# Patient Record
Sex: Female | Born: 1960 | Race: White | Hispanic: No | Smoking: Former smoker
Health system: Southern US, Community
[De-identification: ages and names within clinical notes are randomized; demographics above are authoritative.]

## PROBLEM LIST (undated history)

## (undated) DIAGNOSIS — J45909 Unspecified asthma, uncomplicated: Secondary | ICD-10-CM

## (undated) DIAGNOSIS — B1920 Unspecified viral hepatitis C without hepatic coma: Secondary | ICD-10-CM

## (undated) DIAGNOSIS — E1110 Type 2 diabetes mellitus with ketoacidosis without coma: Secondary | ICD-10-CM

## (undated) DIAGNOSIS — F319 Bipolar disorder, unspecified: Secondary | ICD-10-CM

## (undated) DIAGNOSIS — K760 Fatty (change of) liver, not elsewhere classified: Secondary | ICD-10-CM

## (undated) DIAGNOSIS — E119 Type 2 diabetes mellitus without complications: Secondary | ICD-10-CM

---

## 2001-04-03 ENCOUNTER — Emergency Department (HOSPITAL_COMMUNITY): Admission: EM | Admit: 2001-04-03 | Discharge: 2001-04-04 | Payer: Self-pay | Admitting: *Deleted

## 2003-03-26 ENCOUNTER — Emergency Department (HOSPITAL_COMMUNITY): Admission: EM | Admit: 2003-03-26 | Discharge: 2003-03-26 | Payer: Self-pay | Admitting: Family Medicine

## 2003-04-03 ENCOUNTER — Encounter: Admission: RE | Admit: 2003-04-03 | Discharge: 2003-04-03 | Payer: Self-pay | Admitting: Internal Medicine

## 2004-05-23 ENCOUNTER — Emergency Department (HOSPITAL_COMMUNITY): Admission: EM | Admit: 2004-05-23 | Discharge: 2004-05-23 | Payer: Self-pay | Admitting: Emergency Medicine

## 2005-08-20 ENCOUNTER — Emergency Department (HOSPITAL_COMMUNITY): Admission: EM | Admit: 2005-08-20 | Discharge: 2005-08-20 | Payer: Self-pay | Admitting: Emergency Medicine

## 2006-10-28 ENCOUNTER — Emergency Department: Payer: Self-pay | Admitting: Emergency Medicine

## 2006-10-29 ENCOUNTER — Other Ambulatory Visit: Payer: Self-pay

## 2006-10-29 ENCOUNTER — Emergency Department: Payer: Self-pay | Admitting: Emergency Medicine

## 2007-02-09 ENCOUNTER — Emergency Department: Payer: Self-pay | Admitting: Emergency Medicine

## 2011-11-21 ENCOUNTER — Encounter (HOSPITAL_COMMUNITY): Payer: Self-pay | Admitting: Emergency Medicine

## 2011-11-21 ENCOUNTER — Emergency Department (HOSPITAL_COMMUNITY)
Admission: EM | Admit: 2011-11-21 | Discharge: 2011-11-24 | Disposition: A | Discharge status: Home / Self Care | Payer: MEDICAID | Attending: Emergency Medicine | Admitting: Emergency Medicine

## 2011-11-21 DIAGNOSIS — F29 Unspecified psychosis not due to a substance or known physiological condition: Secondary | ICD-10-CM

## 2011-11-21 DIAGNOSIS — Z046 Encounter for general psychiatric examination, requested by authority: Secondary | ICD-10-CM | POA: Insufficient documentation

## 2011-11-21 DIAGNOSIS — F319 Bipolar disorder, unspecified: Secondary | ICD-10-CM | POA: Insufficient documentation

## 2011-11-21 HISTORY — DX: Bipolar disorder, unspecified: F31.9

## 2011-11-21 HISTORY — DX: Unspecified viral hepatitis C without hepatic coma: B19.20

## 2011-11-21 LAB — COMPREHENSIVE METABOLIC PANEL
ALT: 69 U/L — ABNORMAL HIGH (ref 0–35)
AST: 96 U/L — ABNORMAL HIGH (ref 0–37)
Albumin: 4.2 g/dL (ref 3.5–5.2)
Alkaline Phosphatase: 89 U/L (ref 39–117)
BUN: 7 mg/dL (ref 6–23)
CO2: 22 mEq/L (ref 19–32)
Calcium: 9.9 mg/dL (ref 8.4–10.5)
Chloride: 102 mEq/L (ref 96–112)
Creatinine, Ser: 0.74 mg/dL (ref 0.50–1.10)
GFR calc Af Amer: >90 mL/min (ref >90)
GFR calc non Af Amer: >90 mL/min (ref >90)
Glucose, Bld: 116 mg/dL — ABNORMAL HIGH (ref 70–99)
Potassium: 4 mEq/L (ref 3.5–5.1)
Sodium: 136 mEq/L (ref 135–145)
Total Bilirubin: 1.4 mg/dL — ABNORMAL HIGH (ref 0.3–1.2)
Total Protein: 7.6 g/dL (ref 6.0–8.3)

## 2011-11-21 LAB — RAPID URINE DRUG SCREEN, HOSP PERFORMED
Amphetamines: NOT DETECTED
Barbiturates: NOT DETECTED
Benzodiazepines: NOT DETECTED
Cocaine: NOT DETECTED
Opiates: NOT DETECTED
Tetrahydrocannabinol: NOT DETECTED

## 2011-11-21 LAB — CBC
HCT: 40.6 % (ref 36.0–46.0)
Hemoglobin: 14 g/dL (ref 12.0–15.0)
MCH: 33.3 pg (ref 26.0–34.0)
MCHC: 34.5 g/dL (ref 30.0–36.0)
MCV: 96.7 fL (ref 78.0–100.0)
Platelets: 142 10*3/uL — ABNORMAL LOW (ref 150–400)
RBC: 4.2 MIL/uL (ref 3.87–5.11)
RDW: 12.7 % (ref 11.5–15.5)
WBC: 7.6 10*3/uL (ref 4.0–10.5)

## 2011-11-21 LAB — URINE MICROSCOPIC-ADD ON

## 2011-11-21 LAB — URINALYSIS, ROUTINE W REFLEX MICROSCOPIC
Bilirubin Urine: NEGATIVE
Glucose, UA: NEGATIVE mg/dL
Hgb urine dipstick: NEGATIVE
Ketones, ur: NEGATIVE mg/dL
Nitrite: NEGATIVE
Protein, ur: NEGATIVE mg/dL
Specific Gravity, Urine: 1.01 (ref 1.005–1.030)
Urobilinogen, UA: 1 mg/dL (ref 0.0–1.0)
pH: 6.5 (ref 5.0–8.0)

## 2011-11-21 LAB — AMMONIA: Ammonia: 25 umol/L (ref 11–60)

## 2011-11-21 LAB — LITHIUM LEVEL: Lithium Lvl: 0.57 mEq/L — ABNORMAL LOW (ref 0.80–1.40)

## 2011-11-21 LAB — ETHANOL: Alcohol, Ethyl (B): <11 mg/dL (ref 0–11)

## 2011-11-21 LAB — POCT PREGNANCY, URINE: Preg Test, Ur: NEGATIVE

## 2011-11-21 MED ORDER — LORAZEPAM 1 MG PO TABS
1.0000 mg | ORAL_TABLET | Freq: Three times a day (TID) | ORAL | Status: DC | PRN
  Administered 2011-11-22 – 2011-11-23 (×2): 1 mg via ORAL
  Filled 2011-11-21 (×2): qty 1

## 2011-11-21 MED ORDER — ALUM & MAG HYDROXIDE-SIMETH 200-200-20 MG/5ML PO SUSP: 30.0000 mL | ORAL | Status: DC | PRN

## 2011-11-21 MED ORDER — OLANZAPINE 5 MG PO TABS
5.0000 mg | ORAL_TABLET | Freq: Two times a day (BID) | ORAL | Status: DC
  Administered 2011-11-21 – 2011-11-24 (×6): 5 mg via ORAL
  Filled 2011-11-21 (×8): qty 1

## 2011-11-21 MED ORDER — NICOTINE 21 MG/24HR TD PT24
21.0000 mg | MEDICATED_PATCH | Freq: Every day | TRANSDERMAL | Status: DC
  Administered 2011-11-21 – 2011-11-24 (×4): 21 mg via TRANSDERMAL
  Filled 2011-11-21 (×3): qty 1

## 2011-11-21 MED ORDER — OLANZAPINE 5 MG PO TABS
ORAL_TABLET | ORAL | Status: AC
  Filled 2011-11-21: qty 1

## 2011-11-21 MED ORDER — ACETAMINOPHEN 325 MG PO TABS
650.0000 mg | ORAL_TABLET | ORAL | Status: DC | PRN
  Administered 2011-11-24: 650 mg via ORAL

## 2011-11-21 MED ORDER — ZOLPIDEM TARTRATE 5 MG PO TABS
5.0000 mg | ORAL_TABLET | Freq: Every evening | ORAL | Status: DC | PRN
  Administered 2011-11-21: 5 mg via ORAL
  Filled 2011-11-21: qty 1

## 2011-11-21 MED ORDER — IBUPROFEN 400 MG PO TABS: 400.0000 mg | ORAL_TABLET | Freq: Three times a day (TID) | ORAL | Status: DC | PRN

## 2011-11-21 MED ORDER — ONDANSETRON HCL 4 MG PO TABS: 4.0000 mg | ORAL_TABLET | Freq: Three times a day (TID) | ORAL | Status: DC | PRN

## 2011-11-21 NOTE — ED Notes (Signed)
rn spoke to [name] from act, notified of pts arrival to ed and stated she will be to see pt later today

## 2011-11-21 NOTE — ED Notes (Signed)
ACT is at the bedside with pt

## 2011-11-21 NOTE — ED Notes (Signed)
Tele psych consult initiated

## 2011-11-21 NOTE — ED Provider Notes (Addendum)
History     CSN: [id number]  Arrival date & time 11/21/11  1217   First MD Initiated Contact with Patient 11/21/11 1312      Chief Complaint  Patient presents with  . V70.1     The history is provided by the patient, a relative and a parent. History Limited By: psychosis.  Pt was seen at 1325.  Per pt's father, pt has been "acting strange" for the past week.  Pt's father describes pt's symptoms as staring off in space, taking pictures off the walls, yelling at her mother, and wandering around at night.  Pt has been refusing to go to [facility] this past week for eval, threatening to "jump out of the car" if driven there by her father.  Pt denies SI, no HI.    Past Medical History  Diagnosis Date  . Bipolar 1 disorder   . Hepatitis C     History reviewed. No pertinent past surgical history.   History  Substance Use Topics  . Smoking status: Not on file  . Smokeless tobacco: Not on file  . Alcohol Use:     Review of Systems  Unable to perform ROS: Psychiatric disorder    Allergies  Review of patient's allergies indicates no known allergies.  Home Medications   Current Outpatient Rx  Name Route Sig Dispense Refill  . ACETAMINOPHEN 500 MG PO TABS Oral Take 500 mg by mouth every 6 (six) hours as needed. Pain.    Marland Kitchen LITHIUM CARBONATE 300 MG PO CAPS Oral Take 300 mg by mouth 2 (two) times daily with a meal.      BP 134/76  Pulse 79  Temp 98.3 F (36.8 C) (Oral)  Resp 18  Ht 5\' 6"  (1.676 m)  Wt 120 lb (54.432 kg)  BMI 19.37 kg/m2  SpO2 99%  Physical Exam 1330: Physical examination:  Nursing notes reviewed; Vital signs and O2 SAT reviewed;  Constitutional: Well developed, Well nourished, Well hydrated, In no acute distress; Head:  Normocephalic, atraumatic; Eyes: EOMI, PERRL, No scleral icterus; ENMT: Mouth and pharynx normal, Mucous membranes moist; Neck: Supple, Full range of motion, No lymphadenopathy; Cardiovascular: Regular rate and rhythm, No murmur, rub, or  gallop; Respiratory: Breath sounds clear & equal bilaterally, No rales, rhonchi, wheezes.  Speaking full sentences with ease, Normal respiratory effort/excursion; Chest: Nontender, Movement normal;; Extremities: Pulses normal, No tenderness, No edema, No calf edema or asymmetry.; Neuro: AA&Ox3, Major CN grossly intact.  Speech clear. Gait steady. No gross focal motor or sensory deficits in extremities.; Skin: Color normal, Warm, Dry.; Psych:  Affect flat, poor eye contact, disorganized thoughts, appears to be responding to internal stimuli.   ED Course  Procedures    MDM  MDM Reviewed: nursing note, vitals and previous chart Interpretation: labs   Results for orders placed during the hospital encounter of 11/21/11  URINALYSIS, ROUTINE W REFLEX MICROSCOPIC      Component Value Range   Color, Urine YELLOW  YELLOW   APPearance CLEAR  CLEAR   Specific Gravity, Urine 1.010  1.005 - 1.030   pH 6.5  5.0 - 8.0   Glucose, UA NEGATIVE  NEGATIVE mg/dL   Hgb urine dipstick NEGATIVE  NEGATIVE   Bilirubin Urine NEGATIVE  NEGATIVE   Ketones, ur NEGATIVE  NEGATIVE mg/dL   Protein, ur NEGATIVE  NEGATIVE mg/dL   Urobilinogen, UA 1.0  0.0 - 1.0 mg/dL   Nitrite NEGATIVE  NEGATIVE   Leukocytes, UA MODERATE (*) NEGATIVE  ETHANOL  Component Value Range   Alcohol, Ethyl (B) <11  0 - 11 mg/dL  URINE RAPID DRUG SCREEN (HOSP PERFORMED)      Component Value Range   Opiates NONE DETECTED  NONE DETECTED   Cocaine NONE DETECTED  NONE DETECTED   Benzodiazepines NONE DETECTED  NONE DETECTED   Amphetamines NONE DETECTED  NONE DETECTED   Tetrahydrocannabinol NONE DETECTED  NONE DETECTED   Barbiturates NONE DETECTED  NONE DETECTED  CBC      Component Value Range   WBC 7.6  4.0 - 10.5 K/uL   RBC 4.20  3.87 - 5.11 MIL/uL   Hemoglobin 14.0  12.0 - 15.0 g/dL   HCT 16.1  09.6 - 04.5 %   MCV 96.7  78.0 - 100.0 fL   MCH 33.3  26.0 - 34.0 pg   MCHC 34.5  30.0 - 36.0 g/dL   RDW 40.9  81.1 - 91.4 %    Platelets 142 (*) 150 - 400 K/uL  LITHIUM LEVEL      Component Value Range   Lithium Lvl 0.57 (*) 0.80 - 1.40 mEq/L  COMPREHENSIVE METABOLIC PANEL      Component Value Range   Sodium 136  135 - 145 mEq/L   Potassium 4.0  3.5 - 5.1 mEq/L   Chloride 102  96 - 112 mEq/L   CO2 22  19 - 32 mEq/L   Glucose, Bld 116 (*) 70 - 99 mg/dL   BUN 7  6 - 23 mg/dL   Creatinine, Ser 7.82  0.50 - 1.10 mg/dL   Calcium 9.9  8.4 - 95.6 mg/dL   Total Protein 7.6  6.0 - 8.3 g/dL   Albumin 4.2  3.5 - 5.2 g/dL   AST 96 (*) 0 - 37 U/L   ALT 69 (*) 0 - 35 U/L   Alkaline Phosphatase 89  39 - 117 U/L   Total Bilirubin 1.4 (*) 0.3 - 1.2 mg/dL   GFR calc non Af Amer >90  >90 mL/min   GFR calc Af Amer >90  >90 mL/min  AMMONIA      Component Value Range   Ammonia 25  11 - 60 umol/L  POCT PREGNANCY, URINE      Component Value Range   Preg Test, Ur NEGATIVE  NEGATIVE  URINE MICROSCOPIC-ADD ON      Component Value Range   Squamous Epithelial / LPF MANY (*) RARE   WBC, UA 21-50  <3 WBC/hpf   Bacteria, UA MANY (*) RARE     1600:  Lithium level subtherapeutic.  LFT's mildly elevated, no old to compare.  Udip appears contaminated, UC pending.  +IVC paperwork competed due to pt being flight risk.  Pt sitting on stretcher, staring at the wall and appears to be talking.  Pt now admits to "hearing voices."  [facility] Ella to eval for likely admit.      2100:  ACT requests Telepsych.  T/C to Telepsych MD, case discussed, including:  HPI, pertinent PM/SHx, VS/PE, dx testing, ED course and treatment:  Agreeable to eval.   2200:  Telepsych MD has eval, waiting for recommendations.  Holding orders written.   2255Blima Dessert MD recommends admit, start zyprexa 5mg  PO BID, d/c lithium.    Laray Anger, DO 11/21/11 2255

## 2011-11-21 NOTE — BH Assessment (Addendum)
Assessment Note   [name] is an 51 y.o. female. PT WAS BROUGHT TO THE ED BY HER FATHER DUE TO WHAT HE CALLED "STRANGE BEHAVIOR".  PT HAS NOT SLEEP IN A WEEK AND IS WANDERING THROUGH THE HOUSE AT NIGHT.  SHE IS TAKING PICTURES OF HER FAMILY OFF THE WALL SO SHE LOOK AT THEM PER PT.  SHE ADMITS TO HEARING VOICES BUT REPORTS THEY ARE NOT TELLING HER TO HURT HERSELF.  SHE DID TELL HER FATHER THAT SHE WOULD JUMP OUT OF THE CAR IF HE TRIED TO TAKE HR TO [facility].  SHE REPORTS COMING TO THE ER TO BE TREATED FOR "A BAD HEADACHE", PER PT.  WHEN TOLD SHE NEED HELP SO THE VOICES WOULD STOP, THE PT REPORTED SHE WAS JUST IN [facility] 2-4 WEEKS AGO AND THEY TOLD HER SHE WAS BIPOLAR.  SHE REPORTS BEING PRESCRIBED LITHIUM AND OTHER MEDS WHICH SHE FORGOT THE NAMES OF BUT DOES TAKE THEM AS SHE SHOULD.  PT DENIES S/I AND H/I. PT WOULD LOOK OFF INTO SPACE AND THEN AT CEILING FOR A WHILE AND THEN WOULD TRY TO FOCUS IN ON WHAT WAS BEING ASKED.  SHE WAS WITNESSED BY STAFF TALKING TO THE WALLS.  ELDERLY PARENTS ARE FEARFUL OF TAKING HER HOME WITHOUT TREATMENT.  DR [facility] SIGNED INVOLUNTARY COMMITMENT PAPERS ON PATIENT.         Axis I: Bipolar, Depressed WITH PSYCHOTIC FEATURES Axis II: Deferred Axis III:  Past Medical History  Diagnosis Date  . Bipolar 1 disorder   . Hepatitis C    Axis IV: other psychosocial or environmental problems Axis V: 11-20 some danger of hurting self or others possible OR occasionally fails to maintain minimal personal hygiene OR gross impairment in communication         Past Medical History:  Past Medical History  Diagnosis Date  . Bipolar 1 disorder   . Hepatitis C     History reviewed. No pertinent past surgical history.  Family History: No family history on file.  Social History:  does not have a smoking history on file. She does not have any smokeless tobacco history on file. Her alcohol and drug histories not on file.  Additional Social History:   Alcohol / Drug Use Pain Medications: NA Prescriptions: NA Over the Counter: NA History of alcohol / drug use?: No history of alcohol / drug abuse  CIWA: CIWA-Ar BP: 134/76 mmHg Pulse Rate: 79  COWS:    Allergies: No Known Allergies  Home Medications:  (Not in a hospital admission)  OB/GYN Status:  No LMP recorded.  General Assessment Data Location of Assessment: [facility] Assessment: Yes Living Arrangements: Parent (ELDERLY PARENTS) Can pt return to current living arrangement?: Yes Admission Status: Involuntary Is patient capable of signing voluntary admission?: No Transfer from: [facility]) Referral Source: MD (dr Clarene Duke)  Education Status Contact person: janet [phone]-[phone]  Risk to self Suicidal Ideation: No Suicidal Intent: No Is patient at risk for suicide?: No Suicidal Plan?: No Access to Means: No What has been your use of drugs/alcohol within the last 12 months?: na Previous Attempts/Gestures: No How many times?: 0  Other Self Harm Risks: denies Triggers for Past Attempts: None known Intentional Self Injurious Behavior: None Family Suicide History: Unknown Recent stressful life event(s): Other (Comment) (none known) Persecutory voices/beliefs?: Yes (responding to internal stimuli  pt admits to hearing voices) Depression: Yes Depression Symptoms: Insomnia;Loss of interest in usual pleasures;Feeling angry/irritable;Isolating Substance abuse history and/or  treatment for substance abuse?: No Suicide prevention information given to non-admitted patients: Not applicable  Risk to Others Homicidal Ideation: No Thoughts of Harm to Others: No Current Homicidal Intent: No Current Homicidal Plan: No Access to Homicidal Means: No History of harm to others?: No Assessment of Violence: None Noted Violent Behavior Description: na Does patient have access to weapons?: No Criminal Charges Pending?: No Does patient have a court  date: No  Psychosis Hallucinations: Auditory (unknown pt would not reveal) Delusions: None noted  Mental Status Report Appear/Hygiene: Improved Eye Contact: Fair Motor Activity: Freedom of movement Speech: Tangential;Pressured;Logical/coherent;Elective mutism Level of Consciousness: Alert;Restless Mood: Depressed;Despair;Angry;Helpless;Sad;Other (Comment) (threatening to leave) Affect: Appropriate to circumstance;Depressed;Irritable;Sad Anxiety Level: Minimal Thought Processes: Circumstantial;Tangential;Coherent;Relevant (all thought processes at time during assessment) Judgement: Impaired Orientation: Person;Place;Time;Situation Obsessive Compulsive Thoughts/Behaviors: None  Cognitive Functioning Concentration: Decreased Memory: Recent Impaired;Remote Impaired (pt reports bnot remembering pass 30 days) IQ: Average Insight: Poor Impulse Control: Poor Appetite: Poor Sleep: Decreased (up all night c 1 week) Total Hours of Sleep: 0  Vegetative Symptoms: None  ADLScreening [facility] Assessment Services) Patient's cognitive ability adequate to safely complete daily activities?: Yes Patient able to express need for assistance with ADLs?: Yes Independently performs ADLs?: Yes (appropriate for developmental age)  Abuse/Neglect [facility]) Physical Abuse: Denies Verbal Abuse: Denies Sexual Abuse: Denies  Prior Inpatient Therapy Prior Inpatient Therapy: Yes Prior Therapy Dates: september 2013 Prior Therapy Facilty/Provider([facility]): [facility] Reason for Treatment: SAME BEHAVIOR  Prior Outpatient Therapy Prior Outpatient Therapy: Yes Prior Therapy Dates: CURRENTLY Prior Therapy Facilty/Provider(s): [facility] Reason for Treatment: BIPOLAR  ADL Screening (condition at time of admission) Patient's cognitive ability adequate to safely complete daily activities?: Yes Patient able to express need for assistance with ADLs?: Yes Independently performs ADLs?: Yes (appropriate for  developmental age) Weakness of Legs: None Weakness of Arms/Hands: None  Home Assistive Devices/Equipment Home Assistive Devices/Equipment: None  Therapy Consults (therapy consults require a physician order) PT Evaluation Needed: No OT Evalulation Needed: No SLP Evaluation Needed: No Abuse/Neglect Assessment (Assessment to be complete while patient is alone) Physical Abuse: Denies Verbal Abuse: Denies Sexual Abuse: Denies Exploitation of patient/patient's resources: Denies Self-Neglect: Denies Values / Beliefs Cultural Requests During Hospitalization: None Spiritual Requests During Hospitalization: None Consults Spiritual Care Consult Needed: No Social Work Consult Needed: No Merchant navy officer (For Healthcare) Advance Directive: Patient does not have advance directive;Patient would not like information Pre-existing out of facility DNR order (yellow form or pink MOST form): No    Additional Information 1:1 In Past 12 Months?: No CIRT Risk: No Elopement Risk: No Does patient have medical clearance?: Yes     Disposition: REFERRED TO [facility].  [facility] ACCEPTED TO [facility] AS AN [facility]. Disposition Disposition of Patient: Inpatient treatment program Type of inpatient treatment program: Adult  On Site Evaluation by:   Reviewed with Physician:  DR Margaretmary Bayley   Hattie Perch Winford 11/21/2011 5:28 PM

## 2011-11-21 NOTE — ED Notes (Signed)
Patient c/o headache since this morning. Advil taken without relief. Patient with poor eye contact. Father with patient states patient is bipolar and has been acting "strange". Father reports patient has been taking pictures off the wall at home, yelling at mother. Patient denies this, stating she is fine. Patient denies SI./HI.

## 2011-11-22 LAB — URINE CULTURE
Colony Count: NO GROWTH
Culture: NO GROWTH

## 2011-11-22 MED ORDER — CEPHALEXIN 500 MG PO CAPS
500.0000 mg | ORAL_CAPSULE | Freq: Four times a day (QID) | ORAL | Status: DC
  Administered 2011-11-22 – 2011-11-24 (×10): 500 mg via ORAL
  Filled 2011-11-22 (×9): qty 1

## 2011-11-22 NOTE — ED Notes (Signed)
Tim from [employer] called requesting to have telepsych recommendations faxed to behavioral health, records faxed, confirmation received.

## 2011-11-22 NOTE — ED Notes (Signed)
Per Samson Frederic, (ACT) pt has been accepted at [facility] but will not have a bed until the am,

## 2011-11-22 NOTE — ED Notes (Signed)
Received call from [facility], Pt is not in their district.

## 2011-11-22 NOTE — BHH Counselor (Signed)
Hattie Perch, ACT counselor at [facility], submitted Pt for admission to [facility]. Consulted with Rosey Bath, [facility] who confirmed there is not an appropriate bed available at this time. Shuvon Rankin, NP reviewed clinical information and accepted Pt "pending bed availability and no changes in condition. Please continue to look for placement elsewhere." Notified Hattie Perch of disposition.  Harlin Rain Patsy Baltimore, LPC, NCC

## 2011-11-22 NOTE — ED Notes (Signed)
Received report on pt, pt sitting in room, watching tv, sitter remains at bedside,

## 2011-11-22 NOTE — Progress Notes (Signed)
Received call from Venda Rodes at [facility] stating pt had been accepted by [facility] Rankin,NP pending bed availability and suggested to seek other placement as well. [facility] and [facility], no beds. Informed Dr Clarene Duke and patient's nurse Juliette Alcide. See updated petition.

## 2011-11-22 NOTE — ED Notes (Signed)
Remains improved, O2 switched to N/C 2l. Report  Called and pt transported with nurse

## 2011-11-22 NOTE — ED Notes (Signed)
Pt removed her nicotine patch and requested another stating "i have been here for two days and its time for another one".

## 2011-11-22 NOTE — ED Notes (Signed)
Lunch tray provided.,

## 2011-11-22 NOTE — ED Notes (Signed)
Pt lying on her side, resting with eyes closed, resp even and non labored, sitter remains at bedside,

## 2011-11-22 NOTE — ED Provider Notes (Signed)
Labs/vitals reviewed No issues at this time Awaiting placement uti noted, keflex ordered BP 106/57  Pulse 66  Temp 98.1 F (36.7 C) (Oral)  Resp 18  Ht 5\' 6"  (1.676 m)  Wt 120 lb (54.432 kg)  BMI 19.37 kg/m2  SpO2 100%   Joya Gaskins, MD 11/22/11 [phone]

## 2011-11-22 NOTE — ED Notes (Signed)
Pt given dinner tray, sitter remains at bedside,  

## 2011-11-23 MED ORDER — CEPHALEXIN 500 MG PO CAPS
ORAL_CAPSULE | ORAL | Status: AC
  Filled 2011-11-23: qty 1

## 2011-11-23 NOTE — BHH Counselor (Addendum)
[name] has been accepted at [facility] pending bed availability. At this time no bed has been available. Spoke with E Miller in assessment, ho[pefully a bed will open tomorrow. Patient status given to Dr Judd Lien. After speaking with Kathleen Strong, she appears distracted. She is disheveled and has poor eye contact. She is unsure why she is in the ED and why she to be rehospitalization. She accepts this writer's assessment of her needs. She does report still hearing voices but does not report what they are.

## 2011-11-23 NOTE — ED Provider Notes (Signed)
2952 Patient continued to exhibit psychosis. She has not posed a behavioral problem. Slept though the night. VSS. Awaiting placement.   Nicoletta Dress. Colon Branch, MD 11/23/11 [phone]

## 2011-11-24 ENCOUNTER — Encounter (HOSPITAL_COMMUNITY): Payer: Self-pay | Admitting: Rehabilitation

## 2011-11-24 ENCOUNTER — Inpatient Hospital Stay (HOSPITAL_COMMUNITY)
Admission: AD | Admit: 2011-11-24 | Discharge: 2011-12-02 | DRG: 885 | Disposition: A | Discharge status: Home / Self Care | Payer: Medicaid Other | Source: Ambulatory Visit | Attending: Psychiatry | Admitting: Psychiatry

## 2011-11-24 DIAGNOSIS — F603 Borderline personality disorder: Secondary | ICD-10-CM | POA: Diagnosis present

## 2011-11-24 DIAGNOSIS — Z23 Encounter for immunization: Secondary | ICD-10-CM

## 2011-11-24 DIAGNOSIS — F312 Bipolar disorder, current episode manic severe with psychotic features: Principal | ICD-10-CM | POA: Diagnosis present

## 2011-11-24 DIAGNOSIS — B1920 Unspecified viral hepatitis C without hepatic coma: Secondary | ICD-10-CM | POA: Diagnosis present

## 2011-11-24 DIAGNOSIS — Z9119 Patient's noncompliance with other medical treatment and regimen: Secondary | ICD-10-CM

## 2011-11-24 DIAGNOSIS — Z91199 Patient's noncompliance with other medical treatment and regimen due to unspecified reason: Secondary | ICD-10-CM

## 2011-11-24 MED ORDER — INFLUENZA VIRUS VACC SPLIT PF IM SUSP
0.5000 mL | INTRAMUSCULAR | Status: AC
  Administered 2011-11-25: 0.5 mL via INTRAMUSCULAR

## 2011-11-24 MED ORDER — ACETAMINOPHEN 325 MG PO TABS
650.0000 mg | ORAL_TABLET | Freq: Four times a day (QID) | ORAL | Status: DC | PRN
  Administered 2011-11-25: 650 mg via ORAL

## 2011-11-24 MED ORDER — MAGNESIUM HYDROXIDE 400 MG/5ML PO SUSP: 30.0000 mL | Freq: Every day | ORAL | Status: DC | PRN

## 2011-11-24 MED ORDER — NICOTINE 21 MG/24HR TD PT24
MEDICATED_PATCH | TRANSDERMAL | Status: AC
  Administered 2011-11-24: 21 mg via TRANSDERMAL
  Filled 2011-11-24: qty 1

## 2011-11-24 MED ORDER — PNEUMOCOCCAL VAC POLYVALENT 25 MCG/0.5ML IJ INJ
0.5000 mL | INJECTION | INTRAMUSCULAR | Status: AC
  Administered 2011-11-25: 0.5 mL via INTRAMUSCULAR

## 2011-11-24 MED ORDER — LITHIUM CARBONATE ER 300 MG PO TBCR: 300.0000 mg | EXTENDED_RELEASE_TABLET | Freq: Every day | ORAL | Status: DC

## 2011-11-24 MED ORDER — OLANZAPINE 5 MG PO TABS
5.0000 mg | ORAL_TABLET | Freq: Every day | ORAL | Status: DC
  Administered 2011-11-24 – 2011-11-28 (×5): 5 mg via ORAL
  Filled 2011-11-24 (×9): qty 1

## 2011-11-24 MED ORDER — ALUM & MAG HYDROXIDE-SIMETH 200-200-20 MG/5ML PO SUSP
30.0000 mL | ORAL | Status: DC | PRN
  Administered 2011-11-28: 30 mL via ORAL

## 2011-11-24 MED ORDER — NICOTINE POLACRILEX 2 MG MT GUM
2.0000 mg | CHEWING_GUM | OROMUCOSAL | Status: DC | PRN
  Administered 2011-11-24 – 2011-12-01 (×14): 2 mg via ORAL
  Filled 2011-11-24 (×2): qty 1

## 2011-11-24 MED ORDER — LITHIUM CARBONATE ER 300 MG PO TBCR
600.0000 mg | EXTENDED_RELEASE_TABLET | Freq: Every day | ORAL | Status: DC
  Administered 2011-11-24 – 2011-11-28 (×5): 600 mg via ORAL
  Filled 2011-11-24: qty 2
  Filled 2011-11-24: qty 1
  Filled 2011-11-24 (×3): qty 2
  Filled 2011-11-24: qty 1
  Filled 2011-11-24 (×3): qty 2

## 2011-11-24 MED ORDER — ACETAMINOPHEN 325 MG PO TABS
ORAL_TABLET | ORAL | Status: AC
  Filled 2011-11-24: qty 2

## 2011-11-24 NOTE — BHH Counselor (Signed)
Ms Ordoyne has been accepted to [facility]. She is IVC and will be transported by Patent [employer]. Support paper work completed and distributed. Sponsorship not completed. This is [facility] and after speaking with Ms Hyacinth Meeker of [facility], was informed that the office staff would complete the forms. Dr Homero Fellers of the acceptance.

## 2011-11-24 NOTE — Progress Notes (Signed)
D: Patient states she feel nervous.  Patient cooperative on approach.  Patient states she is fearful and scared.  Patient states she is unsure of why she is here.  Patient states her father told her to go with him to the hospital and she agreed thinking he was going for himself and patient states the next thing she knew she was involuntarily committed.  Patient states she she was not having bizarre behavior at home.  Patient states she took pictures of her kids off the mantle to wipe off the dust and then she ended up here.  Patient denies SI/HI and denies AVH.   A: Staff to monitor Q 15 mins for safety.  Encouragement and support offered.  Scheduled medications administered per orders.  Nicorette gum administered prn.    R: Patient remains safe on the unit. Patient attended group tonight.   Patient calm, cooperative and taking medications. Patient nicotine withdrawal decreased with gum.

## 2011-11-24 NOTE — Progress Notes (Signed)
Pt accepted to [facility] by Shuvon Rankin to Dr Hardie Lora Room 405-2.

## 2011-11-24 NOTE — ED Notes (Signed)
[facility] called for transport to [facility] to go to [facility]. 

## 2011-11-25 ENCOUNTER — Encounter (HOSPITAL_COMMUNITY): Payer: Self-pay

## 2011-11-25 MED ORDER — ACETAMINOPHEN 325 MG PO TABS: 325.0000 mg | ORAL_TABLET | Freq: Four times a day (QID) | ORAL | Status: DC | PRN

## 2011-11-25 MED ORDER — IBUPROFEN 200 MG PO TABS
200.0000 mg | ORAL_TABLET | Freq: Four times a day (QID) | ORAL | Status: DC | PRN
  Administered 2011-11-25 – 2011-11-30 (×11): 200 mg via ORAL
  Filled 2011-11-25 (×11): qty 1
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
## 2011-11-25 NOTE — Discharge Planning (Signed)
Pt attended morning aftercare planning group and tx team. Pt reported that it was her parents' idea for her to come to the hospital after being concerned about her bizarre behaviors at home. Pt reports good appetite but poor sleep. She experiences "moderate depression" including crying spells and feelings of intense sadness. She lives at home with her parents and works as an Biomedical scientist for spending money. Pt does not report auditory/visual hallucinations, reports anxiety/depression as 9, and hopelessness as "high." Pt said that her father was concerned about her after she took some pictures of her son off the walls while crying. Pt stated that her father misunderstood why she was doing this. She sees doctor at [facility] and reported past treatment at  [facility] in 2008 for alcohol abuse issues, but has been sober for two years. Pt said that she has recent diagnosis of Hep C that was difficult for her to handle. She also has a diagnosis of bipolar disorder but states that she does not really understand this diagnosis. [name] attended PM group with Onalee Hua from [facility], and participated in the Q&A part of the group.

## 2011-11-25 NOTE — Treatment Plan (Signed)
Interdisciplinary Treatment Plan Update (Adult)  Date: 11/25/2011  Time Reviewed: 9:52 AM   Progress in Treatment: Attending groups: Yes Participating in groups: Yes Taking medication as prescribed: Yes Tolerating medication: Yes   Family/Significant other contact made:   Patient understands diagnosis:  Yes  As evidenced by asking for help with mood stabilization Discussing patient identified problems/goals with staff:  Yes   See below Medical problems stabilized or resolved:  Yes Denies suicidal/homicidal ideation: Yes  In tx team Issues/concerns per patient self-inventory:  Yes  Sleep fair,  C/O withdrawal symptoms of tremors and chilling  C/O headaches, lightheadedness and pain  Rates pain at an 8 Other:  New problem(s) identified: N/A  Reason for Continuation of Hospitalization: Depression Medication stabilization  Interventions implemented related to continuation of hospitalization:  Restart Lithium,  Antipsychotic trial  Encourage group attendance and participation  Additional comments:  Estimated length of stay: 3-4 days  Discharge Plan: return home and follow up outpt         New goal(s): N/A  Review of initial/current patient goals per problem list:   1.  Goal(s):Stabilize mood  Met:  No  Target date:10/29  As evidenced by: [name] will rate her depression and anxiety at a 3 or less  2.  Goal (s): Identify comprehensive mental wellness plan  Met:  No  Target date:10/29  As evidenced by: self report  3.  Goal(s):  Met:  No  Target date:  As evidenced by:  4.  Goal(s):  Met:  No  Target date:  As evidenced by:  Attendees: Patient: [name]  11/25/2011 9:52 AM  Family:     Physician:  Dr Bernadette Hoit 11/25/2011 9:52 AM   Nursing: Waynetta Sandy   11/25/2011 9:52 AM   Case Manager:  Richelle Ito,  11/25/2011 9:52 AM   Extender:  Verne Spurr PA 11/25/2011 9:52 AM   Other:     Other:     Other:     Other:      Scribe for Treatment Team:     Ida Rogue, 11/25/2011 9:52 AM

## 2011-11-25 NOTE — Progress Notes (Signed)
D:Pt reports moderate depression and anxiety. Pt states that she was in a recent study for hepatitis C treatment and was stopped from taking the medication because it did not help her. She is to follow up in February. Pt reports decrease sleep and was at [facility] two weeks ago. She talked about looking at her children's pictures and becoming tearful. She said that is when her father brought her to the hospital. Pt requested nicotine gum and tylenol for a headache.  A:Gave scheduled and prn medications as ordered. Pt is tolerating well. Offered 15 minute checks and encouragement. Supported pt to discuss feelings. R:Pt is cooperative on unit. She is attending groups and interacting with peers and staff. Pt denies si and hi. She denies hallucinations at this time. Safety maintained on unit.

## 2011-11-25 NOTE — [facility] Suicide Risk Assessment (Signed)
Suicide Risk Assessment  Admission Assessment     Nursing information obtained from:  Patient Demographic factors:  Divorced or widowed Current Mental Status:    Loss Factors:    Historical Factors:    Risk Reduction Factors:     CLINICAL FACTORS:   Bipolar Disorder:   Depressive phase Depression:   Aggression Anhedonia Hopelessness Insomnia  COGNITIVE FEATURES THAT CONTRIBUTE TO RISK:  Closed-mindedness    SUICIDE RISK:   Moderate:  Frequent suicidal ideation with limited intensity, and duration, some specificity in terms of plans, no associated intent, good self-control, limited dysphoria/symptomatology, some risk factors present, and identifiable protective factors, including available and accessible social support.  PLAN OF CARE:1. Admit for crisis management and stabilization. 2. Medication management to reduce current symptoms to base line and improve the  patient's overall level of functioning 3. Treat health problems as indicated. 4. Develop treatment plan to decrease risk of relapse upon discharge and the need for readmission. 5. Psycho-social education regarding relapse prevention and self care. 6. Health care follow up as needed for medical problems. 7. Restart home medications where appropriate.     [name] 11/25/2011, 11:38 AM

## 2011-11-25 NOTE — Progress Notes (Signed)
D: Patient denies SI/HI/AVH at this time.   Patient did attend evening group. Patient visible on the milieu. No distress noted. Pt seemed guarded when talking at 2045. Pt had cravings last night A: Support and encouragement offered. Scheduled medications given to pt. Q 15 min checks continued for patient safety.Pt given PRN nicorette gum for cravings  R: Patient receptive to treatment. Patient remains safe on the unit. Pt taking medication. Nicorette gum effective
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
## 2011-11-26 NOTE — Progress Notes (Signed)
Psychoeducational Group Note  Date:  11/25/2011 Time:  1100  Group Topic/Focus:  Overcoming Stress:   The focus of this group is to define stress and help patients assess their triggers.  Participation Level:  Active  Participation Quality:  Appropriate  Affect:  Appropriate  Cognitive:  Appropriate  Insight:  Good  Engagement in Group:  Good  Additional Comments:    Jiyah Torpey A 11/26/2011, 10:27 AM

## 2011-11-26 NOTE — Progress Notes (Addendum)
Patient ID: [name], female   DOB: 10/09/1960, 51 y.o.   MRN: [id number] Patient has been participating in her treatment, attending groups.  She has had minimal contact with staff.  She was running a low grade fever this am; will recheck.  She denies any SI/HI/AVH at this time.  She complains of some depression; she rates it as a 7.    Continue to monitor medication management and MD orders.  Collaborate with treatment team members regarding patient's POC.  Safety checks completed every 15 minutes per protocol.  Patient's behavior on the milieu has been appropriate.  Encourage patient to continue to attend groups and participate in her treatment. 1415: patient's temperature has dropped from 99.9 to 98.9.

## 2011-11-26 NOTE — Progress Notes (Addendum)
Patient ID: [name], female   DOB: [birth year], 51 y.o.   MRN: [id number] D: Patient denies SI/HI/AVH.Patient did attend evening group. Patient visible on the milieu. No distress noted. Pt complained of earache. Pt thinks she is getting sick because of low grade fever earlier today.  Pt had nicotine cravings. Pt states " I slept most of the day, that's not like me"  A: Support and encouragement offered. Scheduled medications given to pt. Q 15 min checks continued for patient safety. PRN ibuprofen given for ear pain. Prn nicorette gum given for cravings. Pt encouraged to attend groups.   R: Patient receptive. Patient remains safe on the unit. Pt receptive to treatment. Response to Nicorette gum and ibuprofen should be addressed in a future note. Pt attends groups

## 2011-11-26 NOTE — Progress Notes (Signed)
[facility] In Patient Progress Note 11/26/2011 12:17 PM [name] [birth year] [id number] Hospital day #:2 Diagnosis: Bipolar manic with psychosis  ADL's:  Intact Sleep:  Minimally improved Appetite:ok  Groups:Limited  Subjective:   height is 5\' 4"  (1.626 m) and weight is 56.7 kg (125 lb). Her oral temperature is 100 F (37.8 C). Her blood pressure is 101/61 and her pulse is 129. Her respiration is 18.   Objective: Patient remains manic, psychotic and delusional  Ros: Constitutional: WD WN Adult in NAD ENT:      negative for runny nose, sore throat, congestion, dysphagia COR:     negative for cough, SOB, chest pain, wheezing GI:         negative for Nausea, vomiting, diarrhea, constipation, abdominal pain Neuro:  negative for dizziness, blurred vision, headaches, numbness or tingling Ortho:   negative for limb pain, swelling, change in ambulatory status.  Mental Status Exam Level of Consciousness: awake Orientation: x 3 General Appearance:  casual Behavior:  cooperative Eye Contact:  good Motor Behavior:  normal Speech:  Clear but pressured Mood:Irritable Suicidal Ideation: No suicidal ideation, no plan, no intent, no means. Homicidal Ideation:  No homicidal ideation, no plan, no intent, no means.  Affect:  congruent Anxiety Level: mild, moderate, severe Thought Process:  linear Thought Content:  Occasionally hearing voices Perception:  intact Judgment:  Poor, limited, fair, good Insight:  Poor, limited, fair, good, absent Cognition:  Below average, average, above average Sleep:  Number of Hours: 5.5     Lab Results: No results found for this or any previous visit (from the past 48 hour(s)). Labs are reviewed. Physical Findings: AIMS: CIWA-Ar Total: 0  CIWA:  CIWA-Ar Total: 0  COWS:    Medication:   . lithium carbonate  600 mg Oral QHS  . OLANZapine  5 mg Oral QHS    Treatment Plan Summary: 1. Admit for crisis management and stabilization. 2. Medication  management to reduce current symptoms to base line and improve the     patient's overall level of functioning 3. Treat health problems as indicated. 4. Develop treatment plan to decrease risk of relapse upon discharge and the need for         readmission. 5. Psycho-social education regarding relapse prevention and self care. 6. Health care follow up as needed for medical problems. 7. Restart home medications where appropriate.   Plan: Will continue current medication regimen. Thedore Mins, MD 11/26/2011, 12:17 PM

## 2011-11-26 NOTE — Progress Notes (Signed)
Patient did attend the evening karaoke group.  

## 2011-11-26 NOTE — Discharge Planning (Signed)
Pt attended morning aftercare planning group. She reported sleeping well and feeling much better today, although she has a mild fever. Pt said that trying to get to a point where she can be discharged and is hoping for a weekend discharge date. Pt was pleasant and engaged during group. Reola Calkins attended the second group on Emotional Regulation.  She was able to share about positive and negative emotions she experiences, and spent the rest of the time sleeping.  She apologized for sleeping, and clearly felt badly about doing so.

## 2011-11-27 DIAGNOSIS — F312 Bipolar disorder, current episode manic severe with psychotic features: Principal | ICD-10-CM

## 2011-11-27 NOTE — Progress Notes (Signed)
[facility] MD Progress Note  11/27/2011 9:05 PM  Diagnosis:  Bipolar disorder most recent episode manic  ADL's:  Intact  Sleep: Good  Appetite:  Fair  Suicidal Ideation:  denies Homicidal Ideation:  denies  AEB (as evidenced by):  Mental Status Examination/Evaluation: Objective:  Appearance: Casual  Eye Contact::  Good  Speech:  Pressured  Volume:  Normal  Mood:  Anxious  Affect:  Labile  Thought Process:  Circumstantial  Orientation:  Full  Thought Content:  WDL  Suicidal Thoughts:  No  Homicidal Thoughts:  No  Memory:  Immediate;   Fair  Judgement:  Impaired  Insight:  Lacking  Psychomotor Activity:  Normal  Concentration:  Fair  Recall:  Fair  Akathisia:  No  Handed:  Right  AIMS (if indicated):     Assets:  Communication Skills Housing Social Support  Sleep:  Number of Hours: 5    Vital Signs:Blood pressure 90/59, pulse 88, temperature 97.3 F (36.3 C), temperature source Oral, resp. rate 24, height 5\' 4"  (1.626 m), weight 56.7 kg (125 lb). Current Medications: Current Facility-Administered Medications  Medication Dose Route Frequency Provider Last Rate Last Dose  . acetaminophen (TYLENOL) tablet 325 mg  325 mg Oral Q6H PRN Verne Spurr, PA-C      . alum & mag hydroxide-simeth (MAALOX/MYLANTA) 200-200-20 MG/5ML suspension 30 mL  30 mL Oral Q4H PRN Verne Spurr, PA-C      . ibuprofen (ADVIL,MOTRIN) tablet 200 mg  200 mg Oral Q6H PRN Verne Spurr, PA-C   200 mg at 11/27/11 1844  . lithium carbonate (LITHOBID) CR tablet 600 mg  600 mg Oral QHS Verne Spurr, PA-C   600 mg at 11/26/11 2147  . magnesium hydroxide (MILK OF MAGNESIA) suspension 30 mL  30 mL Oral Daily PRN Verne Spurr, PA-C      . nicotine polacrilex (NICORETTE) gum 2 mg  2 mg Oral PRN Mojeed Akintayo   2 mg at 11/27/11 1843  . OLANZapine (ZYPREXA) tablet 5 mg  5 mg Oral QHS Verne Spurr, PA-C   5 mg at 11/26/11 2147    Lab Results: No results found for this or any previous visit (from the past  48 hour(s)).  Physical Findings: AIMS: Facial and Oral Movements Muscles of Facial Expression: None, normal Lips and Perioral Area: None, normal Jaw: None, normal Tongue: None, normal,Extremity Movements Upper (arms, wrists, hands, fingers): None, normal Lower (legs, knees, ankles, toes): None, normal, Trunk Movements Neck, shoulders, hips: None, normal, Overall Severity Severity of abnormal movements (highest score from questions above): None, normal Incapacitation due to abnormal movements: None, normal Patient's awareness of abnormal movements (rate only patient's report): No Awareness, Dental Status Current problems with teeth and/or dentures?: No Does patient usually wear dentures?: No  CIWA:  CIWA-Ar Total: 0  COWS:     Treatment Plan Summary: Daily contact with patient to assess and evaluate symptoms and progress in treatment Medication management  Plan: 1. Will continue current medication. 2. Monitor as patient continues to stabilize. 3. Lithium level in AM.  [name] 11/27/2011, 9:05 PM

## 2011-11-27 NOTE — Discharge Planning (Signed)
Pt attended morning aftercare planning group. She reported that her parents should be coming today. SW informed pt that she will probably discharge early next week when Lithium levels are up to therapeutic level. Pt was sleepy due to meds, but reported feeling well this morning. Pt attended and participated in afternoon group where the topic was "finding balance in life."  She spoke of needing help from parents to identify when she is imbalanced which led to a general discussion of importance of having others who can help [place].

## 2011-11-27 NOTE — Progress Notes (Signed)
  D) Patient resting quietly in room upon my assessment. Patient states slept " well," and  appetite is " good." Patient has a "normal" energy level and "good" ability to pay attention. Patient rates hopeless feelings as  7/10. Patient denies SI/HI, denies A/V hallucinations.   A) Patient offered support and encouragement, patient encouraged to discuss feelings/concerns with staff. Patient verbalized understanding. Patient monitored Q15 minutes for safety. Patient met with MD to discuss today's goals and plan of care.  R) Patient isolates to room at times, attending some  groups in day room and all meals in dining room. Patient insightful today, describes a plan to "eat and sleep better" once she is discharged from [facility]. Patient taking medications as ordered. Will continue to monitor.

## 2011-11-27 NOTE — Progress Notes (Signed)
Agree with assessment and note by previous RN

## 2011-11-27 NOTE — Progress Notes (Signed)
Psychoeducational Group Note  Date:  11/27/2011 Time:  2000  Group Topic/Focus:  Wrap-Up Group:   The focus of this group is to help patients review their daily goal of treatment and discuss progress on daily workbooks.  Participation Level:  Active  Participation Quality:  Appropriate  Affect:  Angry and Appropriate  Cognitive:  Appropriate  Insight:  Good  Engagement in Group:  Good  Additional Comments:    Adelina Mings 11/27/2011, 9:50 PM

## 2011-11-28 LAB — LITHIUM LEVEL: Lithium Lvl: 0.47 mEq/L — ABNORMAL LOW (ref 0.80–1.40)

## 2011-11-28 NOTE — Clinical Social Work Psych Note (Signed)
[facility] Group Notes:  (Counselor/Nursing/MHT/Case Management/Adjunct)  11/28/2011   Type of Therapy:  Group Therapy  Participation Level:  Active  Participation Quality:  Appropriate  Affect:  Appropriate  Cognitive:  Alert  Insight:  Good  Engagement in Group:  Good  Engagement in Therapy:  Good  Modes of Intervention:  Socialization, Support, Clarification and Education  Summary of Progress/Problems:The process group therapy session focused on self-sabotaging and enabling behaviors. The patients were guided to identify and discuss how these behaviors related to their individual situations and how to limit the behaviors in their personal lives. The patient stated that she pushed away supportive relationships.    Gerrell Tabet Claudette Laws, [place] 11/28/2011 12:46 PM

## 2011-11-28 NOTE — Progress Notes (Signed)
Psychoeducational Group Note  Date:  11/28/2011 Time:  0945 am  Group Topic/Focus:  Identifying Needs:   The focus of this group is to help patients identify their personal needs that have been historically problematic and identify healthy behaviors to address their needs.  Participation Level:  Active  Participation Quality:  Appropriate and Attentive  Affect:  Appropriate  Cognitive:  Alert and Appropriate  Insight:  Good  Engagement in Group:  Good  Additional Comments:    Andrena Mews 11/28/2011,12:11 PM

## 2011-11-28 NOTE — Progress Notes (Addendum)
Patient ID: [name], female   DOB: 1960/12/18, 51 y.o.   MRN: [id number] D. The patient has a blunted affect. Interacted appropriately in the milieu. Attended the evening wrap up group. Stated that she will follow up with [facility] after discharge. A. Encouraged patient to attend evening wrap up group. Discussed discharge plans. Administered HS medications. R. The patient is compliant with medications. Attending groups. Denied any suicidal ideation. Denied a/v hallucinations.

## 2011-11-28 NOTE — Progress Notes (Signed)
D   Pt is anxious but otherwise appropriate   She interacts with select peers and is pleasant and cooperative   She attended groups today   She reports feeling better and feels she should do better at home following more of a schedule so she can go to bed early and get up early so she can get a few things done that she has been putting off   She said she thinks if she could do this she would feel better about herself A   Verbal support given   Medications administered effectiveness monitored   Provide diversional activities and structured enviroment   Q 15 min checks R   Pt safe at present

## 2011-11-28 NOTE — Progress Notes (Signed)
[facility] Group Notes:  (Counselor/Nursing/MHT/Case Management/Adjunct)  11/28/2011 1000  Type of Therapy:  Nurse Education  Self Inventory Sheets   Participation Level:  Active  Participation Quality:  Appropriate  Affect:  Appropriate  Cognitive:  Alert  Insight:  Good  Engagement in Group:  Good  Engagement in Therapy:  Good  Modes of Intervention:  Education  Summary of Progress/Problems:   [name] 11/28/2011, 1000

## 2011-11-29 MED ORDER — OLANZAPINE 7.5 MG PO TABS
7.5000 mg | ORAL_TABLET | Freq: Every day | ORAL | Status: DC
  Administered 2011-11-29 – 2011-12-01 (×3): 7.5 mg via ORAL
  Filled 2011-11-29 (×5): qty 1
  Filled 2011-11-29: qty 3

## 2011-11-29 MED ORDER — LITHIUM CARBONATE ER 450 MG PO TBCR
900.0000 mg | EXTENDED_RELEASE_TABLET | Freq: Every day | ORAL | Status: DC
  Administered 2011-11-29 – 2011-12-01 (×3): 900 mg via ORAL
  Filled 2011-11-29 (×5): qty 2

## 2011-11-29 NOTE — Clinical Social Work Psych Note (Signed)
[facility] Group Notes:  (Counselor/Nursing/MHT/Case Management/Adjunct)  11/29/2011   Type of Therapy:  Group Therapy  Participation Level:  Active  Participation Quality:  Appropriate  Affect:  Appropriate  Cognitive:  Alert  Insight:  Good  Engagement in Group:  Good  Engagement in Therapy:  Good  Modes of Intervention:  Socialization, Support, Clarification and Education  Summary of Progress/Problems:The process group therapy focused on each patient identifying the healthy and unhealthy supports in their environment.  Then each patient processed the new supports needed to help them to remain healthy after discharge from the hospital.  The patient stated that her mother and counselor were her main supports and she did not need any added supports to help her after discharge from the hospital.   Estevan Ryder, [facility] 11/29/2011 12:56 PM

## 2011-11-29 NOTE — Progress Notes (Signed)
Patient ID: [name], female   DOB: 05-Sep-1960, 51 y.o.   MRN: [id number] D. The patient has a flat mood and sad affect. Stated she is ready for discharge. When asked if she had been compliant with her medication at home, stated that she had, but if you asked her father he would say no. Her lithium level is still below therapeutic level. Attended and actively participated in group. A. Met with patient 1:1. Encouraged to attend evening wrap up group. Reviewed medication and lab results. Administered HS medication. R. Compliant with medication. Attended and actively participated in evening group.

## 2011-11-29 NOTE — Progress Notes (Signed)
Psychoeducational Group Note  Date:  11/29/2011 Time:  1015  Group Topic/Focus:  Identifying Needs:   The focus of this group is to help patients identify their personal needs that have been historically problematic and identify healthy behaviors to address their needs.  Participation Level: Did Not Attend  Participation Quality:  Not Applicable  Affect:  Not Applicable  Cognitive:  Not Applicable  Insight:  Not Applicable  Engagement in Group: Not Applicable  Additional Comments: 1015  Thom Ollinger Lynn 11/29/2011, 1:28 PM  

## 2011-11-29 NOTE — Progress Notes (Signed)
D Pt has mentioned about feelings of anxiety today, has been out and has been interacting and attending various milieu activities today, pt has received all medication without incident A Support and encouragement provided R Will continue to monitor

## 2011-11-29 NOTE — Progress Notes (Signed)
[facility] In Patient Progress Note 11/29/2011 4:01 PM [name] Feb 27, 1960 [id number] Hospital day #:5 Diagnosis:   Bipolar disorder most recent episode manic ADL's:  Intact Sleep:  Minimally improved Appetite:ok  Groups:Good  Subjective: Patient states she is doing well and has no new complaints. She is asking for discharge tomorrow.   height is 5\' 4"  (1.626 m) and weight is 56.7 kg (125 lb). Her oral temperature is 98.5 F (36.9 C). Her blood pressure is 110/63 and her pulse is 101. Her respiration is 18.   Objective: Review of notes and labs indicate that the lithium level is still sub therapeutic.  ROS:  ROS:    Constitutional: WDWN Adult in NAD   GI: Negative for N,V,D,C   Neuro: Negative for dizziness, blurred vision, visual changes, headaches   Resp: Negative for wheezing, SOB, cough   Cardio: Negative for CP, diaphoresis, fatigue   MSK: Negative for joint pain, swelling, DROM, or ambulatory difficulties. Mental Status Exam Level of Consciousness: awake Orientation: x 3 General Appearance:  casual Behavior:  Cooperative and a little guarded Eye Contact:  good Motor Behavior:  normal Insight:   limited, Cognition:   average,  Sleep:  Number of Hours: 2     Lab Results:  Results for orders placed during the hospital encounter of 11/24/11 (from the past 48 hour(s))  LITHIUM LEVEL     Status: Abnormal   Collection Time   11/28/11  6:45 AM      Component Value Range Comment   Lithium Lvl 0.47 (*) 0.80 - 1.40 mEq/L    Labs are reviewed. Physical Findings: AIMS: CIWA-Ar Total: 0  CIWA:  CIWA-Ar Total: 0  COWS:    Medication:   . lithium carbonate  900 mg Oral QHS  . OLANZapine  7.5 mg Oral QHS  . DISCONTD: lithium carbonate  600 mg Oral QHS  . DISCONTD: OLANZapine  5 mg Oral QHS    Treatment Plan Summary: 1. Admit for crisis management and stabilization. 2. Medication management to reduce current symptoms to base line and improve the patient's overall level  of functioning 3. Treat health problems as indicated. 4. Develop treatment plan to decrease risk of relapse upon discharge and the need for readmission. 5. Psycho-social education regarding relapse prevention and self care. 6. Health care follow up as needed for medical problems. 7. Restart home medications where appropriate.   Plan: 1. Will increase the Lithium to 900mg  at hs. 2. Will increase the zyprexa to 7.5mg  at hs. 3. Will continue to follow. 4. Will recheck lithium level in 2 days. 5. Anticipate D/C Wednesday AM. Lloyd Huger T. Jerimie Mancuso PAC 11/29/2011, 4:01 PM

## 2011-11-29 NOTE — Progress Notes (Signed)
The focus of this group is to help patients review their daily goal of treatment and discuss progress on daily workbooks. Pt attended the evening wrap-up group and responded appropriately to discussion prompts from the Writer about how her day was going and about personal needs.

## 2011-11-29 NOTE — Progress Notes (Signed)
Patient ID: [name], female   DOB: 1960-07-13, 51 y.o.   MRN: [id number] The patient is pleasant and appropriate. Interacts well in milieu. Had a sad mood and affect this evening. Stated she was disappointed that she was not going home tomorrow due to her Lithium level being under the therapeutic level. Attended and actively participated in evening wrap up group.

## 2011-11-29 NOTE — Progress Notes (Signed)
[facility] Group Notes:  (Counselor/Nursing/MHT/Case Management/Adjunct)  11/29/2011 09:15AM  Type of Therapy:  Nurse Education  Participation Level:  Active  Participation Quality:  Appropriate, Attentive and Sharing  Affect:  Anxious, Blunted, Depressed and Irritable  Cognitive:  Alert, Appropriate and Oriented  Insight:  Limited  Engagement in Group:  Good  Engagement in Therapy:  Good  Modes of Intervention:  Clarification, Education, Problem-solving and Support  Summary of Progress/Problems: Pt. Offered her opinions and suggestions appropriately, but seemed skeptical of how her recovery could be effective (depressed).   Pixie Casino [facility] 11/29/2011, 10:21 AM

## 2011-11-30 NOTE — Progress Notes (Signed)
D: Patient standing in the doorway of her room on approach.  Patient initiates conversation with Clinical research associate.  Patient states, "I think I have been here long enough, I am ready to go home now."  Patient denies depression and anxiety.  Patient complains of a headache.  Patient denies SI/HI and denies AVH.  Patient states when she is discharged she will go back to live with her parents. A: Staff to monitor Q 15 mins for safety.  Encouragement and support offered.  Scheduled medications administered per orders.  Ibuprofen prn administered for a headache and nicorette gum administered for nicotine cravings. R: Patient remains safe on the unit.  Patient attended group tonight.  Patient taking administered medications.  Patient states headache and nicotine cravings have ceased.

## 2011-11-30 NOTE — H&P (Signed)
  Read and agreed.  Shyne Resch, MD 11/30/2011 8:58 AM  

## 2011-11-30 NOTE — Discharge Planning (Signed)
[name] shared her disappointment in AM group that her lithium level is low, and thus she will not be able to d/c today.  Thoughts clear, mood good but understandably depressed due to circumstances beyond her control.   [name] attended PM group on wellness.  She participated in the exercises, and  fell asleep for much of the remainder of group.  She did wake up near the end and shared that she relaxes by listening to music.

## 2011-11-30 NOTE — Progress Notes (Signed)
Patient ID: [name], female   DOB: April 05, 1960, 51 y.o.   MRN: [id number] Patient has been participating in group activities; she denies any SI/HI/AVH.  She is anticipating discharge this week.  Her lithium level needs to be at a therapeutic level before she is ready for discharge.  She complained of some left hip pain today; ibuprofen administered with relief.  She has clear thought processes and is motivated for treatment.  Continue to monitor for medication management and MD orders.  Collaborate with treatment team regarding patient's POC.  Safety checks continued every 15 minutes per protocol.  Patient's behavior has been appropriate on the milieu.  She has been interacting well with staff and peers.

## 2011-11-30 NOTE — Progress Notes (Signed)
Psychoeducational Group Note  Date:  11/30/2011 Time:  1100  Group Topic/Focus:  Wellness Toolbox:   The focus of this group is to discuss various aspects of wellness, balancing those aspects and exploring ways to increase the ability to experience wellness.  Patients will create a wellness toolbox for use upon discharge.  Participation Level:  Minimal  Participation Quality:  Drowsy  Affect:  Appropriate  Cognitive:  Appropriate  Insight:  Limited  Engagement in Group:  Limited  Additional Comments:  Pt participated and processed in group.  [name], [name] 11/30/2011, 5:11 PM

## 2011-11-30 NOTE — Progress Notes (Signed)
[facility] MD Progress Note  11/30/2011 5:18 PM  Diagnosis:  Bipolar disorder current episode manic severe with psychotic features  ADL's:  Intact Appetite:  Good  Assets:  Communication Skills Desire for Improvement Housing Intimacy Physical Health Resilience Social Support  Sleep:  Number of Hours: 6    Review of Systems  Constitutional: Negative.  Negative for fever, chills, weight loss, malaise/fatigue and diaphoresis.  HENT: Negative for congestion and sore throat.   Eyes: Negative for blurred vision, double vision and photophobia.  Respiratory: Negative for cough, shortness of breath and wheezing.   Cardiovascular: Negative for chest pain, palpitations and PND.  Gastrointestinal: Negative for heartburn, nausea, vomiting, abdominal pain, diarrhea and constipation.  Musculoskeletal: Negative for myalgias, joint pain and falls.  Neurological: Negative for dizziness, tingling, tremors, sensory change, speech change, focal weakness, seizures, loss of consciousness, weakness and headaches.  Endo/Heme/Allergies: Negative for polydipsia. Does not bruise/bleed easily.  Psychiatric/Behavioral: Negative for depression, suicidal ideas, hallucinations, memory loss and substance abuse. The patient is not nervous/anxious and does not have insomnia.    Vital Signs: height is 5\' 4"  (1.626 m) and weight is 56.7 kg (125 lb). Her oral temperature is 98.3 F (36.8 C). Her blood pressure is 99/66 and her pulse is 88. Her respiration is 16.   Physical Exam  Constitutional: She is oriented to person, place, and time. Vital signs are normal. She appears well-developed and well-nourished. She is sleeping and active.  Non-toxic appearance. She does not have a sickly appearance. She does not appear ill. No distress.  HENT:  Head: Normocephalic and atraumatic.  Neurological: She is alert and oriented to person, place, and time. She displays no atrophy, no tremor and normal reflexes. No cranial nerve deficit  or sensory deficit. She exhibits normal muscle tone. She displays no seizure activity. Coordination and gait normal.  Psychiatric: Her speech is normal and behavior is normal. Her mood appears anxious. Her affect is not angry, not blunt, not labile and not inappropriate. She is not agitated, not aggressive, is not hyperactive, not slowed, not withdrawn, not actively hallucinating and not combative. Thought content is not paranoid and not delusional. Cognition and memory are not impaired. She expresses impulsivity and inappropriate judgment. She does not exhibit a depressed mood. She expresses no homicidal and no suicidal ideation. She expresses no suicidal plans and no homicidal plans. She exhibits normal recent memory and normal remote memory.       She has above average intelligence but her recent memory of her mania is impaired. She is attentive.    Current Medications: Current Facility-Administered Medications  Medication Dose Route Frequency Provider Last Rate Last Dose  . acetaminophen (TYLENOL) tablet 325 mg  325 mg Oral Q6H PRN Verne Spurr, PA-C      . alum & mag hydroxide-simeth (MAALOX/MYLANTA) 200-200-20 MG/5ML suspension 30 mL  30 mL Oral Q4H PRN Verne Spurr, PA-C   30 mL at 11/28/11 2231  . ibuprofen (ADVIL,MOTRIN) tablet 200 mg  200 mg Oral Q6H PRN Verne Spurr, PA-C   200 mg at 11/30/11 0809  . lithium carbonate (ESKALITH) CR tablet 900 mg  900 mg Oral QHS Verne Spurr, PA-C   900 mg at 11/29/11 2109  . magnesium hydroxide (MILK OF MAGNESIA) suspension 30 mL  30 mL Oral Daily PRN Verne Spurr, PA-C      . nicotine polacrilex (NICORETTE) gum 2 mg  2 mg Oral PRN Mojeed Akintayo   2 mg at 11/30/11 0809  . OLANZapine (ZYPREXA) tablet 7.5  mg  7.5 mg Oral QHS Verne Spurr, PA-C   7.5 mg at 11/29/11 2109    Lab Results: Lithium level is pending for tomorrow morning.  AIMS: Facial and Oral Movements Muscles of Facial Expression: None, normal Lips and Perioral Area: None,  normal Jaw: None, normal Tongue: None, normal,Extremity Movements Upper (arms, wrists, hands, fingers): None, normal Lower (legs, knees, ankles, toes): None, normal, Trunk Movements Neck, shoulders, hips: None, normal, Overall Severity Severity of abnormal movements (highest score from questions above): None, normal Incapacitation due to abnormal movements: None, normal Patient's awareness of abnormal movements (rate only patient's report): No Awareness, Dental Status Current problems with teeth and/or dentures?: No Does patient usually wear dentures?: No  CIWA:  CIWA-Ar Total: 0  COWS:     Treatment Plan Summary: Daily contact with patient to assess and evaluate symptoms and progress in treatment Medication management  Plan: 1. Lithium level is pending for in the morning and if all continues to go well we will anticipate d/c home. Allannah Kempen 11/30/2011, 5:18 PM

## 2011-11-30 NOTE — Progress Notes (Signed)
Psychoeducational Group Note  Date:  11/30/2011 Time:  2000  Group Topic/Focus:  Wrap-Up Group:   The focus of this group is to help patients review their daily goal of treatment and discuss progress on daily workbooks.  Participation Level:  Active  Participation Quality:  Appropriate  Affect:  Appropriate  Cognitive:  Appropriate  Insight:  Good  Engagement in Group:  Good  Additional Comments:  Patient attended and participated in group tonight. She reports that she had an O.K day. She is anxious about going home. She slept and exercise today. For her wellness she will like to exercise more  Scot Dock 11/30/2011, 10:19 PM

## 2011-12-01 LAB — LITHIUM LEVEL: Lithium Lvl: 0.51 mEq/L — ABNORMAL LOW (ref 0.80–1.40)

## 2011-12-01 NOTE — Progress Notes (Signed)
D: Patient in dayroom interacting with peers on approach.  Patient states she had a good day today but states she is ready to be discharged but is having problems with her lithium levels.  Patient states she does not understand why her levels will not go up and states she has never had this happen before.  Patient states her parent visited today and it was a good visit.  Patient smiled when speaking with Clinical research associate and states she was able to speak with her sons on the phone today.  Patient states she is going to try to think positive from now on.  Patient states she has to learn how to control her feelings.  Patient denies depression and denies anxiety.  Patient denies SI/HI and denies AVH.   A: Staff to monitor Q 15 mins for safety.  Encouragement and support offered.  Scheduled medications administered per orders. R: Patient remains safe on the unit.  Patient calm but frustrated because she states she is ready to go home and states her lithium levels are not going up.  Patient taking administered medications.

## 2011-12-01 NOTE — Progress Notes (Signed)
The focus of this group is to help patients review their daily goal of treatment and discuss progress on daily workbooks. Pt attended the evening group and participated in the discussion about finding positives and expressing personal needs. Pt comments toward the discussion were appropriate.

## 2011-12-01 NOTE — Discharge Planning (Signed)
[name] attended AM group, good participation.  Stated she is hopeful that her d/c is imminent, but is concerned that her blood has not yet been drawn.  Stated she expressed this to her nurse, and asked that I follow up with it as well. Spoke to her parents this afternoon who are pleased with her progress and are willing to take her home.  They plan to visit tonite. [name] attended PM with the theme of diagnosis. She felt that diagnosis is a positive, not negative thing.  It has been helpful for her to further understand her symptoms.

## 2011-12-01 NOTE — Treatment Plan (Signed)
Interdisciplinary Treatment Plan Update (Adult)  Date: 12/01/2011  Time Reviewed: 10:00 AM   Progress in Treatment: Attending groups: Yes Participating in groups: Yes Taking medication as prescribed: Yes Tolerating medication: Yes   Family/Significant other contact made:   Patient understands diagnosis:  Yes Discussing patient identified problems/goals with staff:  Yes Medical problems stabilized or resolved:  Yes Denies suicidal/homicidal ideation: Yes  In tx team Issues/concerns per patient self-inventory:  None Other:  New problem(s) identified: N/A  Reason for Continuation of Hospitalization: Medication stabilization  Interventions implemented related to continuation of hospitalization: Encourage group attendance and participation  Additional comments:  Estimated length of stay: 1-2 days, possible d/c tomorrow  Discharge Plan: stay with parents, follow up outpt  New goal(s): N/A  Review of initial/current patient goals per problem list:   1.  Goal(s): Stabilize mood  Met:  Yes  Target date:10/29  As evidenced [name] states that she has 0 depression  2.  Goal (s):Identify comprehensive mental wellness plan  Met:  Yes  Target date:10/29  As evidenced [name] plans to follow up at [facility] in [place]  3.  Goal(s):  Met:  Yes  Target date:  As evidenced by:  4.  Goal(s):  Met:  Yes  Target date:  As evidenced by:  Attendees: Patient:  [name] 12/01/2011 10:00  Family:     Physician:  Lorenda Ishihara Akintayo 12/01/2011 10:00 AM   Nursing: Joslyn Devon   12/01/2011 10:00 AM   Clinical Social Worker:  Richelle Ito 12/01/2011 10:00 AM   Extender:  Verne Spurr PA 12/01/2011 10:00 AM   Other:     Other:     Other:     Other:      Scribe for Treatment Team:   Daryel Gerald B, 12/01/2011 10:00 AM

## 2011-12-01 NOTE — Progress Notes (Signed)
Patient ID: [name], female   DOB: 10/06/1960, 51 y.o.   MRN: [id number] Patient has been attending groups today; interacts well with staff and peers on the unit.  Patient is awaiting lithium level to be therapeutic level before discharge.  Lab will draw another level tonight.  Patient is eager for discharge.  She denies any SI/HI/AVH.    Continue to monitor medication management and MD orders.  Collaborate with treatment team members regarding patient's POC.  Continue with safety checks every 15 minutes per protocol.  Patient's behavior has been appropriate on the unit today.

## 2011-12-01 NOTE — Progress Notes (Signed)
Psychoeducational Group Note  Date:  12/01/2011 Time: 0930  Group Topic/Focus:  Recovery Goals:   The focus of this group is to identify appropriate goals for recovery and establish a plan to achieve them.  Participation Level:  Active  Participation Quality:  Appropriate  Affect:  Appropriate  Cognitive:  Appropriate  Insight:  Good  Engagement in Group:  Good  Additional Comments:  none  Vassie Kugel M 12/01/2011, 11:13 AM

## 2011-12-01 NOTE — Progress Notes (Signed)
[facility] MD Progress Note  12/01/2011 3:03 PM LEAFIE NADON  MRN:  [id number]  Diagnosis:  Bipolar disorder, MRE manic with severe psychotic features  ADL's:  Intact  Sleep: Good  Appetite:  Fair  Suicidal Ideation:  denies Homicidal Ideation:  denies Subjective: [name] is understandably disappointed that her Lithium level was not drawn this morning. She states that she is ok with this and will wait for it to be drawn tonight. She states she is doing OK and has no new complaints. She appears flat affected.  Mental Status Examination/Evaluation: Objective:  Appearance: Casual  Eye Contact::  Fair  Speech:  Clear and Coherent  Volume:  Normal  Mood:  Depressed  Affect:  Flat  Thought Process:  Linear  Orientation:  Full  Thought Content:  WDL  Suicidal Thoughts:  No  Homicidal Thoughts:  No  Memory:  Immediate;   Fair Recent;   Fair Remote;   Fair  Judgement:  Poor  Insight:  Present  Psychomotor Activity:  Normal  Concentration:  Fair  Recall:  Fair  Akathisia:  No  Handed:  Right  AIMS (if indicated):     Assets:  Desire for Improvement Housing  Sleep:  Number of Hours: 6    Vital Signs:Blood pressure 95/65, pulse 89, temperature 98.4 F (36.9 C), temperature source Oral, resp. rate 20, height 5\' 4"  (1.626 m), weight 56.7 kg (125 lb).  Objective: [name] appears more flat affected today. She states she is severely depressed and reports she is anxious. She states she is open to trying depakote if needed.  Current Medications: Current Facility-Administered Medications  Medication Dose Route Frequency Provider Last Rate Last Dose  . acetaminophen (TYLENOL) tablet 325 mg  325 mg Oral Q6H PRN Verne Spurr, PA-C      . alum & mag hydroxide-simeth (MAALOX/MYLANTA) 200-200-20 MG/5ML suspension 30 mL  30 mL Oral Q4H PRN Verne Spurr, PA-C   30 mL at 11/28/11 2231  . ibuprofen (ADVIL,MOTRIN) tablet 200 mg  200 mg Oral Q6H PRN Verne Spurr, PA-C   200 mg at 11/30/11  1942  . lithium carbonate (ESKALITH) CR tablet 900 mg  900 mg Oral QHS Verne Spurr, PA-C   900 mg at 11/30/11 2132  . magnesium hydroxide (MILK OF MAGNESIA) suspension 30 mL  30 mL Oral Daily PRN Verne Spurr, PA-C      . nicotine polacrilex (NICORETTE) gum 2 mg  2 mg Oral PRN Mojeed Akintayo   2 mg at 11/30/11 1942  . OLANZapine (ZYPREXA) tablet 7.5 mg  7.5 mg Oral QHS Verne Spurr, PA-C   7.5 mg at 11/30/11 2132    Lab Results: No results found for this or any previous visit (from the past 48 hour(s)).  Physical Findings: AIMS:   CIWA:  CIWA-Ar Total: 0  COWS:     Treatment Plan Summary: Daily contact with patient to assess and evaluate symptoms and progress in treatment Medication management  1. Daily contact with patient to assess and evaluate symptoms and progress in treatment.  2. Medication management  3. The patient will deny suicidal ideations or homicidal ideations for 48 hours prior to discharge and have a depression and anxiety rating of 3 or less.The patient will also deny any auditory or visual hallucinations or delusional thinking.  4. The patient will deny any symptoms of substance withdrawal at time of discharge.  Plan: 1. Will re-order lithium level to be drawn tonight before her PM dose. 2. Will plan on discharge in 1-2  days if depression decreases and lithium is at therapeutic level. 3. Will initiate Depakote if depression is still increased tomorrow.  [name] 12/01/2011, 3:03 PM

## 2011-12-02 MED ORDER — LITHIUM CARBONATE ER 450 MG PO TBCR
1200.0000 mg | EXTENDED_RELEASE_TABLET | Freq: Every day | ORAL | Status: DC
  Filled 2011-12-02: qty 1
  Filled 2011-12-02: qty 56

## 2011-12-02 MED ORDER — OLANZAPINE 7.5 MG PO TABS: 7.5000 mg | ORAL_TABLET | Freq: Every day | ORAL | Status: DC

## 2011-12-02 MED ORDER — LITHIUM CARBONATE ER 300 MG PO TBCR: 1200.0000 mg | EXTENDED_RELEASE_TABLET | Freq: Every day | ORAL | Status: DC

## 2011-12-02 NOTE — Progress Notes (Signed)
Psychoeducational Group Note  Date:  12/02/2011 Time:  1100  Group Topic/Focus:  Crisis Planning:   The purpose of this group is to help patients create a crisis plan for use upon discharge or in the future, as needed.  Participation Level:  Did Not Attend  Participation Quality:    Affect:    Cognitive:    Insight:    Engagement in Group:    Additional Comments: pt was in treatment team.  [name] 12/02/2011, 2:26 PM

## 2011-12-02 NOTE — Progress Notes (Signed)
[name] Case Management Discharge Plan:  Will you be returning to the same living situation after discharge: Yes,  home with parents At discharge, do you have transportation home?:Yes,  mother Do you have the ability to pay for your medications:Yes,  Medicaid  Release of information consent forms completed and in the chart;  Patient's signature needed at discharge.  Patient to Follow up at:  Follow-up Information    Follow up with [facility]. (Arrive at 8am Monday thru Friday)    Contact information:   [address] [place], [place] [address] [phone] fax [phone]         Patient denies SI/HI:   Yes,  yes    Safety Planning and Suicide Prevention discussed:  Yes,  yes  Barrier to discharge identified:No.  Summary and Recommendations:   [name], [name] 12/02/2011, 10:09 AM

## 2011-12-02 NOTE — [facility] Suicide Risk Assessment (Signed)
Suicide Risk Assessment  Discharge Assessment     Demographic Factors:  Female  Mental Status Per Nursing Assessment::   On Admission:     Current Mental Status by Physician: Patient denies suicidal ideations, intent or plan  Loss Factors: none reported by the patient  Historical Factors: none  Risk Reduction Factors:   Living with another person, especially a relative, Positive social support, Positive therapeutic relationship and Positive coping skills or problem solving skills  Continued Clinical Symptoms:  Bipolar manic  Cognitive Features That Contribute To Risk:  none    Suicide Risk:  Minimal: No identifiable suicidal ideation.  Patients presenting with no risk factors but with morbid ruminations; may be classified as minimal risk based on the severity of the depressive symptoms  Discharge Diagnoses:   AXIS I:  Bipolar, Manic severe with psychosis AXIS II:  Deferred AXIS III:   Past Medical History  Diagnosis Date  . Hepatitis C    AXIS IV:  occupational problems and other psychosocial or environmental problems AXIS V:  61-70 mild symptoms  Plan Of Care/Follow-up recommendations:  Activity:  as tolerated Diet:  healthy Tests:  Lithium level Other:  Pateint to keep follow up appointment, take her medications as recommended.  Is patient on multiple antipsychotic therapies at discharge:  No   Has Patient had three or more failed trials of antipsychotic monotherapy by history:  No  Recommended Plan for Multiple Antipsychotic Therapies: None   Thedore Mins, MD 12/02/2011, 10:52 AM

## 2011-12-02 NOTE — Progress Notes (Signed)
Pt discharged per MD orders; pt currently denies SI/HI and auditory/visual hallucinations; pt was given education by RN regarding follow-up appointments and medications and pt denied any questions or concerns about these instructions; pt was then escorted to search room to retrieve her belongings by RN before being discharged to hospital lobby. 

## 2011-12-04 NOTE — Progress Notes (Signed)
Patient Discharge Instructions:  After Visit Summary (AVS):   Faxed to:  12/04/11 Psychiatric Admission Assessment Note:   Faxed to:  12/04/11 Suicide Risk Assessment - Discharge Assessment:   Faxed to:  12/04/11 Faxed/Sent to the Next Level Care provider:  12/04/11 Faxed to [facility] @ [phone]  Jerelene Redden, 12/04/2011, 3:54 PM

## 2011-12-08 NOTE — Discharge Summary (Signed)
Physician Discharge Summary Note  Patient:  [name] is an 51 y.o., female MRN:  [id number] DOB:  04-Nov-1960 Patient phone:  [phone] (home)  Patient address:   [address] [place] [address]   Date of Admission:  11/24/2011 Date of Discharge: 12/02/2011  Discharge Diagnoses: Principal Problem:  *Bipolar disorder, current episode manic severe with psychotic features  Axis Diagnosis:  Discharge Diagnoses:  AXIS I: Bipolar, Manic severe with psychosis  AXIS II: Deferred  AXIS III:  Past Medical History   Diagnosis  Date   .  Hepatitis C    AXIS IV: occupational problems and other psychosocial or environmental problems  AXIS V: 61-70 mild symptoms   Level of Care:  OP  Hospital Course:  This patient was brought to [facility] by her father who reported that she had had increasingly bizarre behavior over the past week including taking pictures off the wall, and leaving with people we don't know.  [name] has a history of bipolar disorder and was recently admitted to [facility] for her mania.  Shortly after discharge her symptoms increased and she began engaging in more bizarre behaviors resulting in her father bringing her to the ED.  She was evaluated by the treatment team and a plan of treatment was initiated to treat her symptoms of psychosis and pressured speech. She responded well to Zyprexa and Lithium was restarted as well.    Jeremy was titrated to a dose of 1200mg  of Lithium CR at hs and her Zyprexa was increased to 7.5.  Her initial lithium level was subtherapeutic and her dose was increased again. A second level was done and she was still slightly below therapeutic level.  She was given a prescription with instructions to increase the dose to 1200 mg at bedtime.  [name] was adamant about discharge that day and was felt by the treatment team to be stable for discharge the same day.  She was to follow up with her outpatient provider as scheduled and have a follow up  lithium level done.       Medication List     As of 12/08/2011  5:10 PM    STOP taking these medications         acetaminophen 500 MG tablet   Commonly known as: TYLENOL      lithium carbonate 300 MG capsule      TAKE these medications      Indication    lithium carbonate 300 MG CR tablet   Commonly known as: LITHOBID   Take 4 tablets (1,200 mg total) by mouth at bedtime.    Indication: Acute Mania, Manic-Depression      OLANZapine 7.5 MG tablet   Commonly known as: ZYPREXA   Take 1 tablet (7.5 mg total) by mouth at bedtime.    Indication: Manic-Depression, Trouble Sleeping       Follow-up Information    Follow up with [facility]. On 12/04/2011. (Arrive between 8 and 9 for your hospital follow up appointment)    Contact information:   [address] [place], [place] [address] [phone] fax [phone]    Upon discharge, patient adamantly denies suicidal, homicidal ideations, auditory, visual hallucinations and or delusional thinking. They left [facility] with all personal belongings via personal transportation in no apparent distress.  Consults:  none  Significant Diagnostic Studies:  labs  Discharge Vitals:   Blood pressure 110/67, pulse 100, temperature 98 F (36.7 C), temperature source Oral, resp. rate 16, height 5\' 4"  (1.626 m),  weight 56.7 kg (125 lb)..  Mental Status Exam: See Mental Status Examination and Suicide Risk Assessment completed by Attending Physician prior to discharge.  Discharge destination: Home  Is patient on multiple antipsychotic therapies at discharge:  No  Has Patient had three or more failed trials of antipsychotic monotherapy by history: N/A Recommended Plan for Multiple Antipsychotic Therapies: N/A Discharge Orders    Future Orders Please Complete By Expires   Diet - low sodium heart healthy      Increase activity slowly      Discharge instructions      Comments:   Take all of your medications as prescribed.  Be sure to keep ALL  follow up appointments as scheduled. This is to ensure getting your refills on time to avoid any interruption in your medication.  If you find that you can not keep your appointment, call the clinic and reschedule. Be sure to tell the nurse if you will need a refill before your appointment.     Follow-up Information    Follow up with [facility]. On 12/04/2011. (Arrive between 8 and 9 for your hospital follow up appointment)    Contact information:   [address] , [place] [address] [phone] fax [phone]        Follow-up recommendations:   Activities: Resume typical activities Diet:         Resume typical diet Tests:      Repeat lithium level Other: Follow up with outpatient provider and report any side effects to out patient prescriber.  Comments:  Take all your medications as prescribed by your mental healthcare provider. Report any adverse effects and or reactions from your medicines to your outpatient provider promptly. Patient is instructed and cautioned to not engage in alcohol and or illegal drug use while on prescription medicines. In the event of worsening symptoms, patient is instructed to call the crisis hotline, 911 and or go to the nearest ED for appropriate evaluation and treatment of symptoms.  Signed:  Rona Ravens. Merial Moritz [facility] 12/08/2011 5:10 PM

## 2011-12-09 NOTE — Discharge Summary (Signed)
Seen and agreed. Shazia Mitchener, MD 

## 2012-01-22 ENCOUNTER — Inpatient Hospital Stay (HOSPITAL_COMMUNITY)
Admission: EM | Admit: 2012-01-22 | Discharge: 2012-01-24 | DRG: 638 | Disposition: A | Discharge status: Home / Self Care | Payer: MEDICAID | Attending: Internal Medicine | Admitting: Internal Medicine

## 2012-01-22 ENCOUNTER — Encounter (HOSPITAL_COMMUNITY): Payer: Self-pay

## 2012-01-22 DIAGNOSIS — E8779 Other fluid overload: Secondary | ICD-10-CM | POA: Diagnosis not present

## 2012-01-22 DIAGNOSIS — K7689 Other specified diseases of liver: Secondary | ICD-10-CM | POA: Diagnosis present

## 2012-01-22 DIAGNOSIS — R748 Abnormal levels of other serum enzymes: Secondary | ICD-10-CM

## 2012-01-22 DIAGNOSIS — K760 Fatty (change of) liver, not elsewhere classified: Secondary | ICD-10-CM

## 2012-01-22 DIAGNOSIS — J90 Pleural effusion, not elsewhere classified: Secondary | ICD-10-CM | POA: Diagnosis present

## 2012-01-22 DIAGNOSIS — E1310 Other specified diabetes mellitus with ketoacidosis without coma: Principal | ICD-10-CM | POA: Diagnosis present

## 2012-01-22 DIAGNOSIS — E871 Hypo-osmolality and hyponatremia: Secondary | ICD-10-CM

## 2012-01-22 DIAGNOSIS — F312 Bipolar disorder, current episode manic severe with psychotic features: Secondary | ICD-10-CM

## 2012-01-22 DIAGNOSIS — F17200 Nicotine dependence, unspecified, uncomplicated: Secondary | ICD-10-CM | POA: Diagnosis present

## 2012-01-22 DIAGNOSIS — E119 Type 2 diabetes mellitus without complications: Secondary | ICD-10-CM

## 2012-01-22 DIAGNOSIS — E1110 Type 2 diabetes mellitus with ketoacidosis without coma: Secondary | ICD-10-CM

## 2012-01-22 DIAGNOSIS — D6959 Other secondary thrombocytopenia: Secondary | ICD-10-CM | POA: Diagnosis present

## 2012-01-22 DIAGNOSIS — E876 Hypokalemia: Secondary | ICD-10-CM | POA: Diagnosis not present

## 2012-01-22 DIAGNOSIS — R0902 Hypoxemia: Secondary | ICD-10-CM

## 2012-01-22 DIAGNOSIS — B1920 Unspecified viral hepatitis C without hepatic coma: Secondary | ICD-10-CM

## 2012-01-22 DIAGNOSIS — Z79899 Other long term (current) drug therapy: Secondary | ICD-10-CM

## 2012-01-22 DIAGNOSIS — J9819 Other pulmonary collapse: Secondary | ICD-10-CM | POA: Diagnosis present

## 2012-01-22 HISTORY — DX: Type 2 diabetes mellitus with ketoacidosis without coma: E11.10

## 2012-01-22 HISTORY — DX: Fatty (change of) liver, not elsewhere classified: K76.0

## 2012-01-22 HISTORY — DX: Type 2 diabetes mellitus without complications: E11.9

## 2012-01-22 LAB — URINALYSIS, ROUTINE W REFLEX MICROSCOPIC
Bilirubin Urine: NEGATIVE
Glucose, UA: >1000 mg/dL — AB
Hgb urine dipstick: NEGATIVE
Ketones, ur: >80 mg/dL — AB
Leukocytes, UA: NEGATIVE
Nitrite: NEGATIVE
Protein, ur: NEGATIVE mg/dL
Specific Gravity, Urine: 1.015 (ref 1.005–1.030)
Urobilinogen, UA: 0.2 mg/dL (ref 0.0–1.0)
pH: 6 (ref 5.0–8.0)

## 2012-01-22 LAB — BASIC METABOLIC PANEL
BUN: 8 mg/dL (ref 6–23)
BUN: 5 mg/dL — ABNORMAL LOW (ref 6–23)
BUN: 5 mg/dL — ABNORMAL LOW (ref 6–23)
BUN: 5 mg/dL — ABNORMAL LOW (ref 6–23)
BUN: 5 mg/dL — ABNORMAL LOW (ref 6–23)
CO2: 13 mEq/L — ABNORMAL LOW (ref 19–32)
CO2: 16 mEq/L — ABNORMAL LOW (ref 19–32)
CO2: 15 mEq/L — ABNORMAL LOW (ref 19–32)
CO2: 18 mEq/L — ABNORMAL LOW (ref 19–32)
CO2: 17 mEq/L — ABNORMAL LOW (ref 19–32)
Calcium: 9.1 mg/dL (ref 8.4–10.5)
Calcium: 8 mg/dL — ABNORMAL LOW (ref 8.4–10.5)
Calcium: 7.8 mg/dL — ABNORMAL LOW (ref 8.4–10.5)
Calcium: 8 mg/dL — ABNORMAL LOW (ref 8.4–10.5)
Calcium: 8 mg/dL — ABNORMAL LOW (ref 8.4–10.5)
Chloride: 94 mEq/L — ABNORMAL LOW (ref 96–112)
Chloride: 100 mEq/L (ref 96–112)
Chloride: 97 mEq/L (ref 96–112)
Chloride: 100 mEq/L (ref 96–112)
Chloride: 101 mEq/L (ref 96–112)
Creatinine, Ser: 0.57 mg/dL (ref 0.50–1.10)
Creatinine, Ser: 0.55 mg/dL (ref 0.50–1.10)
Creatinine, Ser: 0.56 mg/dL (ref 0.50–1.10)
Creatinine, Ser: 0.58 mg/dL (ref 0.50–1.10)
Creatinine, Ser: 0.55 mg/dL (ref 0.50–1.10)
GFR calc Af Amer: >90 mL/min (ref >90)
GFR calc Af Amer: >90 mL/min (ref >90)
GFR calc Af Amer: >90 mL/min (ref >90)
GFR calc Af Amer: >90 mL/min (ref >90)
GFR calc Af Amer: >90 mL/min (ref >90)
GFR calc non Af Amer: >90 mL/min (ref >90)
GFR calc non Af Amer: >90 mL/min (ref >90)
GFR calc non Af Amer: >90 mL/min (ref >90)
GFR calc non Af Amer: >90 mL/min (ref >90)
GFR calc non Af Amer: >90 mL/min (ref >90)
Glucose, Bld: 344 mg/dL — ABNORMAL HIGH (ref 70–99)
Glucose, Bld: 110 mg/dL — ABNORMAL HIGH (ref 70–99)
Glucose, Bld: 310 mg/dL — ABNORMAL HIGH (ref 70–99)
Glucose, Bld: 166 mg/dL — ABNORMAL HIGH (ref 70–99)
Glucose, Bld: 230 mg/dL — ABNORMAL HIGH (ref 70–99)
Potassium: 3.4 mEq/L — ABNORMAL LOW (ref 3.5–5.1)
Potassium: 2.6 mEq/L — CL (ref 3.5–5.1)
Potassium: 2.7 mEq/L — CL (ref 3.5–5.1)
Potassium: 2.9 mEq/L — ABNORMAL LOW (ref 3.5–5.1)
Potassium: 3.7 mEq/L (ref 3.5–5.1)
Sodium: 130 mEq/L — ABNORMAL LOW (ref 135–145)
Sodium: 134 mEq/L — ABNORMAL LOW (ref 135–145)
Sodium: 131 mEq/L — ABNORMAL LOW (ref 135–145)
Sodium: 132 mEq/L — ABNORMAL LOW (ref 135–145)
Sodium: 131 mEq/L — ABNORMAL LOW (ref 135–145)

## 2012-01-22 LAB — GLUCOSE, CAPILLARY
Glucose-Capillary: 272 mg/dL — ABNORMAL HIGH (ref 70–99)
Glucose-Capillary: 127 mg/dL — ABNORMAL HIGH (ref 70–99)
Glucose-Capillary: 121 mg/dL — ABNORMAL HIGH (ref 70–99)
Glucose-Capillary: 239 mg/dL — ABNORMAL HIGH (ref 70–99)
Glucose-Capillary: 346 mg/dL — ABNORMAL HIGH (ref 70–99)
Glucose-Capillary: 231 mg/dL — ABNORMAL HIGH (ref 70–99)
Glucose-Capillary: 132 mg/dL — ABNORMAL HIGH (ref 70–99)
Glucose-Capillary: 217 mg/dL — ABNORMAL HIGH (ref 70–99)
Glucose-Capillary: 194 mg/dL — ABNORMAL HIGH (ref 70–99)

## 2012-01-22 LAB — MAGNESIUM: Magnesium: 1.7 mg/dL (ref 1.5–2.5)

## 2012-01-22 LAB — LIPASE, BLOOD: Lipase: 48 U/L (ref 11–59)

## 2012-01-22 LAB — BLOOD GAS, VENOUS
Acid-Base Excess: 13.2 mmol/L — ABNORMAL HIGH (ref 0.0–2.0)
Bicarbonate: 12.1 mEq/L — ABNORMAL LOW (ref 20.0–24.0)
FIO2: 21 %
O2 Saturation: 96.4 %
Patient temperature: 37
TCO2: 11.1 mmol/L (ref 0–100)
pCO2, Ven: 25.9 mmHg — ABNORMAL LOW (ref 45.0–50.0)
pH, Ven: 7.291 (ref 7.250–7.300)
pO2, Ven: 86.2 mmHg — ABNORMAL HIGH (ref 30.0–45.0)

## 2012-01-22 LAB — CBC
HCT: 39 % (ref 36.0–46.0)
Hemoglobin: 13.8 g/dL (ref 12.0–15.0)
MCH: 32.9 pg (ref 26.0–34.0)
MCHC: 35.4 g/dL (ref 30.0–36.0)
MCV: 93.1 fL (ref 78.0–100.0)
Platelets: 135 10*3/uL — ABNORMAL LOW (ref 150–400)
RBC: 4.19 MIL/uL (ref 3.87–5.11)
RDW: 13.2 % (ref 11.5–15.5)
WBC: 9.7 10*3/uL (ref 4.0–10.5)

## 2012-01-22 LAB — KETONES, QUALITATIVE

## 2012-01-22 LAB — LITHIUM LEVEL: Lithium Lvl: 0.84 mEq/L (ref 0.80–1.40)

## 2012-01-22 LAB — MRSA PCR SCREENING: MRSA by PCR: NEGATIVE

## 2012-01-22 LAB — TROPONIN I
Troponin I: <0.3 ng/mL (ref <0.30)
Troponin I: <0.3 ng/mL (ref <0.30)

## 2012-01-22 LAB — URINE MICROSCOPIC-ADD ON

## 2012-01-22 LAB — LACTIC ACID, PLASMA: Lactic Acid, Venous: 0.9 mmol/L (ref 0.5–2.2)

## 2012-01-22 LAB — POCT PREGNANCY, URINE: Preg Test, Ur: NEGATIVE

## 2012-01-22 MED ORDER — SODIUM CHLORIDE 0.9 % IV SOLN
INTRAVENOUS | Status: DC
  Filled 2012-01-22: qty 1

## 2012-01-22 MED ORDER — ACETAMINOPHEN 325 MG PO TABS
650.0000 mg | ORAL_TABLET | Freq: Four times a day (QID) | ORAL | Status: DC | PRN
  Administered 2012-01-23 – 2012-01-24 (×2): 650 mg via ORAL
  Filled 2012-01-22 (×2): qty 2

## 2012-01-22 MED ORDER — ENOXAPARIN SODIUM 40 MG/0.4ML ~~LOC~~ SOLN: 40.0000 mg | SUBCUTANEOUS | Status: DC

## 2012-01-22 MED ORDER — ONDANSETRON HCL 4 MG/2ML IJ SOLN: 4.0000 mg | Freq: Four times a day (QID) | INTRAMUSCULAR | Status: DC | PRN

## 2012-01-22 MED ORDER — BENZTROPINE MESYLATE 1 MG PO TABS
0.5000 mg | ORAL_TABLET | Freq: Two times a day (BID) | ORAL | Status: DC
  Administered 2012-01-22 – 2012-01-24 (×4): 0.5 mg via ORAL
  Filled 2012-01-22 (×3): qty 1
  Filled 2012-01-22: qty 2

## 2012-01-22 MED ORDER — DEXTROSE 50 % IV SOLN: 25.0000 mL | INTRAVENOUS | Status: DC | PRN

## 2012-01-22 MED ORDER — ENOXAPARIN SODIUM 40 MG/0.4ML ~~LOC~~ SOLN
40.0000 mg | SUBCUTANEOUS | Status: DC
  Administered 2012-01-22: 40 mg via SUBCUTANEOUS
  Filled 2012-01-22: qty 0.4

## 2012-01-22 MED ORDER — NICOTINE 14 MG/24HR TD PT24
14.0000 mg | MEDICATED_PATCH | Freq: Every day | TRANSDERMAL | Status: DC
  Administered 2012-01-22 – 2012-01-24 (×3): 14 mg via TRANSDERMAL
  Filled 2012-01-22 (×3): qty 1

## 2012-01-22 MED ORDER — LITHIUM CARBONATE ER 300 MG PO TBCR
300.0000 mg | EXTENDED_RELEASE_TABLET | Freq: Two times a day (BID) | ORAL | Status: DC
  Administered 2012-01-22 – 2012-01-24 (×4): 300 mg via ORAL
  Filled 2012-01-22 (×9): qty 1

## 2012-01-22 MED ORDER — POLYVINYL ALCOHOL 1.4 % OP SOLN
1.0000 [drp] | Freq: Every day | OPHTHALMIC | Status: DC | PRN
  Filled 2012-01-22: qty 15

## 2012-01-22 MED ORDER — SODIUM CHLORIDE 0.9 % IV SOLN
INTRAVENOUS | Status: DC
  Administered 2012-01-22 (×2): 1000 mL via INTRAVENOUS

## 2012-01-22 MED ORDER — POLYETHYLENE GLYCOL 3350 17 G PO PACK
17.0000 g | PACK | Freq: Every day | ORAL | Status: DC
  Administered 2012-01-22 – 2012-01-24 (×3): 17 g via ORAL
  Filled 2012-01-22 (×3): qty 1

## 2012-01-22 MED ORDER — ACETAMINOPHEN 650 MG RE SUPP: 650.0000 mg | Freq: Four times a day (QID) | RECTAL | Status: DC | PRN

## 2012-01-22 MED ORDER — POTASSIUM CHLORIDE CRYS ER 20 MEQ PO TBCR
40.0000 meq | EXTENDED_RELEASE_TABLET | ORAL | Status: AC
  Administered 2012-01-22 (×2): 40 meq via ORAL
  Filled 2012-01-22: qty 2
  Filled 2012-01-22: qty 1

## 2012-01-22 MED ORDER — SODIUM CHLORIDE 0.9 % IV BOLUS (SEPSIS)
1000.0000 mL | Freq: Once | INTRAVENOUS | Status: AC
  Administered 2012-01-22: 1000 mL via INTRAVENOUS

## 2012-01-22 MED ORDER — POTASSIUM CHLORIDE 10 MEQ/100ML IV SOLN
10.0000 meq | INTRAVENOUS | Status: AC
  Administered 2012-01-22 (×3): 10 meq via INTRAVENOUS

## 2012-01-22 MED ORDER — POTASSIUM CHLORIDE 10 MEQ/100ML IV SOLN
10.0000 meq | INTRAVENOUS | Status: DC
  Filled 2012-01-22: qty 200

## 2012-01-22 MED ORDER — POTASSIUM CHLORIDE 10 MEQ/100ML IV SOLN
10.0000 meq | INTRAVENOUS | Status: AC
  Administered 2012-01-22 (×3): 10 meq via INTRAVENOUS
  Filled 2012-01-22: qty 400

## 2012-01-22 MED ORDER — SODIUM CHLORIDE 0.9 % IV SOLN
INTRAVENOUS | Status: DC
  Administered 2012-01-22: 2.1 [IU]/h via INTRAVENOUS
  Filled 2012-01-22: qty 1

## 2012-01-22 MED ORDER — SODIUM CHLORIDE 0.9 % IV SOLN
INTRAVENOUS | Status: AC
  Administered 2012-01-22: 17:00:00 via INTRAVENOUS

## 2012-01-22 MED ORDER — HYPROMELLOSE (GONIOSCOPIC) 2.5 % OP SOLN: 1.0000 [drp] | Freq: Every day | OPHTHALMIC | Status: DC | PRN

## 2012-01-22 MED ORDER — ONDANSETRON HCL 4 MG PO TABS: 4.0000 mg | ORAL_TABLET | Freq: Four times a day (QID) | ORAL | Status: DC | PRN

## 2012-01-22 MED ORDER — DEXTROSE-NACL 5-0.45 % IV SOLN
INTRAVENOUS | Status: DC
  Administered 2012-01-22: 1000 mL via INTRAVENOUS

## 2012-01-22 MED ORDER — OLANZAPINE 5 MG PO TABS
7.5000 mg | ORAL_TABLET | Freq: Every day | ORAL | Status: DC
  Administered 2012-01-22 – 2012-01-23 (×2): 7.5 mg via ORAL
  Filled 2012-01-22 (×2): qty 2

## 2012-01-22 NOTE — ED Notes (Signed)
Report given to ICU. Ready to receive patient.

## 2012-01-22 NOTE — ED Notes (Signed)
CRITICAL VALUE ALERT  Critical value received:  PCO2, PO2 venous blood gas  Date of notification:  01/22/12  Time of notification:  1248  Critical value read back:yes  Nurse who received alert:  Lake Bells, RN  MD notified (1st page):  Dr Bebe Shaggy   Time of first page:  1250   No orders received

## 2012-01-22 NOTE — Progress Notes (Addendum)
Nutrition Brief Note  Patient identified on the Malnutrition Screening Tool (MST) Report  Body mass index is 21.74 kg/(m^2). Pt meets criteria for normal range based on current BMI.    Wt Readings from Last 10 Encounters:  01/22/12 134 lb 11.2 oz (61.1 kg)  11/24/11 125 lb (56.7 kg)  11/21/11 120 lb (54.432 kg)   Current diet order is CHO Modified (1600-2000 kcal) , patient is consuming approximately n/a % of meals at this time. Labs and medications reviewed.  Consult pending for diabetes coordinator and also recommend pt follow up with outpatient RD to attend diabetes nutrition education classes.   #[phone]

## 2012-01-22 NOTE — Progress Notes (Deleted)
[facility]  History and Physical   Jacqulyne L Hennigan MRN:[id number] DOB: 04/14/1960 DOA: 01/22/2012   Referring physician: Dr. Wickline  PCP: No primary provider on file.  Specialists: [facility] for psychiatry   Chief Complaint: abnormal labs   HPI: [name] is a 51 y.o. female who has a history of bipolar disorder and was sent to the emergency room for routine lab work. It was felt that her lithium level may be high, and she was sent to the emergency room by her psychiatrist. Patient was found to have elevated glucose and a significant metabolic acidosis. On further questioning, she admits to having increased thirst for the past 2 weeks. She's also had frequent urination and occasional blurry vision. Patient has a history of gestational diabetes in the past. She reports the only new medication that she's been started on was Cogentin. She's not had any fever, cough, shortness of breath, dysuria. She's had dry heaves but no vomiting. She has not had any abdominal cramping or diarrhea. Her by mouth intake has been very poor for the past few days. She was given IV fluids and started on an insulin infusion. She was referred for admission.   Review of Systems: pertinent positives as per HPI, otherwise negative   Past Medical History   Diagnosis  Date   .  Bipolar 1 disorder    .  Hepatitis C     History reviewed. No pertinent past surgical history.   Social History: reports that she has been smoking Cigarettes. She has been smoking about 1 pack per day. She does not have any smokeless tobacco history on file. She reports that she does not drink alcohol or use illicit drugs.   No Known Allergies   Family History: Patient's aunt and uncle have a history of diabetes. No diabetes in her parents.   Prior to Admission medications   Medication  Sig  Start Date  End Date  Taking?  Authorizing Provider   acetaminophen (TYLENOL) 500 MG tablet  Take 500 mg by mouth every 6 (six) hours as needed.  Pain    Yes  Historical Provider, MD   benztropine (COGENTIN) 0.5 MG tablet  Take 0.5 mg by mouth 2 (two) times daily.    Yes  Historical Provider, MD   hydroxypropyl methylcellulose (ISOPTO TEARS) 2.5 % ophthalmic solution  Place 1 drop into both eyes daily as needed. Dry Eyes    Yes  Historical Provider, MD   lithium carbonate (LITHOBID) 300 MG CR tablet  Take 300 mg by mouth 2 (two) times daily.  12/02/11   Yes  Neil Mashburn, PA-C   OLANZapine (ZYPREXA) 15 MG tablet  Take 7.5 mg by mouth at bedtime.    Yes  Historical Provider, MD    Physical Exam:  Filed Vitals:    01/22/12 1300  01/22/12 1311  01/22/12 1314  01/22/12 1348   BP:   131/64   121/57   Pulse:  84   84  80   Temp:       TempSrc:     Oral   Resp:    21  17   Height:     5' 6" (1.676 m)   Weight:     61.1 kg (134 lb 11.2 oz)   SpO2:  100%   99%  100%    General: NAD  Eyes: PERRLA  ENT: dry mucous membranes  Neck: supple  Cardiovascular: s1, s2, rrr  Respiratory: cta b  Abdomen: soft, nt,   nd, bs+  Skin: normal  Musculoskeletal: deferred  Psychiatric: anxious, co operative with exam  Neurologic: grossly intact, non focal  Labs on Admission:   Basic Metabolic Panel:   Lab  01/22/12 1005   NA  130*   K  3.4*   CL  94*   CO2  13*   GLUCOSE  344*   BUN  8   CREATININE  0.57   CALCIUM  9.1   MG  --   PHOS  --    Liver Function Tests:  No results found for this basename: AST:5,ALT:5,ALKPHOS:5,BILITOT:5,PROT:5,ALBUMIN:5 in the last 168 hours  No results found for this basename: LIPASE:5,AMYLASE:5 in the last 168 hours  No results found for this basename: AMMONIA:5 in the last 168 hours  CBC:   Lab  01/22/12 1136   WBC  9.7   NEUTROABS  --   HGB  13.8   HCT  39.0   MCV  93.1   PLT  135*    Cardiac Enzymes:  No results found for this basename: CKTOTAL:5,CKMB:5,CKMBINDEX:5,TROPONINI:5 in the last 168 hours  BNP (last 3 results)  No results found for this basename: PROBNP:3 in the last 8760 hours   CBG:   Lab  01/22/12 1259   GLUCAP  272*    Radiological Exams on Admission:  No results found.   Assessment/Plan  Principal Problem:  *DKA (diabetic ketoacidoses)  Active Problems:  Bipolar disorder, current episode manic severe with psychotic features  Diabetes mellitus  Hyponatremia   1. Diabetic ketoacidosis. The patient has significant metabolic acidosis. Her blood sugars elevated. ABG shows the metabolic acidosis with a pH of 7.29. Patient has been started on IV fluids and insulin drip per protocol. We will check serum ketones, serum lactic acid. We'll also check hemoglobin A1c. Patient will be transitioned to subcutaneous insulin once her anion gap has closed. She may be able to maintain her blood sugars on oral hypoglycemics. We'll followup on A1c. Lithium does cause hyperglycemia. Although this is not a new medication for her. Considering that the patient does have significant bipolar, it is likely more beneficial to continue lithium then discontinuing it at this point. Her levels are in normal range. We will also check serum lipase and LFTs. Patient will need diabetic teaching. We will continue IV fluids, follow serum chemistries closely.  Code Status: Full code  Family Communication: Discussed with patient  Disposition Plan: Discharge home once improved   Time spent: 45 minutes   [name]  [facility]  Pager [phone]   If 7PM-7AM, please contact night-coverage  [email or web address]  Password [email or web address]  01/22/2012, 4:12 PM  

## 2012-01-22 NOTE — ED Provider Notes (Signed)
History  This chart was scribed for Joya Gaskins, MD by Ardeen Jourdain, ED Scribe. This patient was seen in room APA05/APA05 and the patient's care was started at 1112.  CSN: [id number]  Arrival date & time 01/22/12  1017   First MD Initiated Contact with Patient 01/22/12 1112      Chief Complaint  Patient presents with  . Labs Only    Patient is a 51 y.o. female presenting with weakness. The history is provided by the patient. No language interpreter was used.  Weakness The primary symptoms include dizziness and nausea. Primary symptoms do not include headaches or fever. The symptoms began yesterday. The symptoms are waxing and waning. The neurological symptoms are diffuse.  Dizziness also occurs with nausea and weakness.  Additional symptoms include weakness. Additional symptoms do not include pain.    [name] is a 51 y.o. female who presents to the Emergency Department complaining of needing blood work to check her lithium level and kidney function. She reports having dizziness and HA as associated symptoms. She states [facility] sent her here for the tests and possible treatment.  Past Medical History  Diagnosis Date  . Bipolar 1 disorder   . Hepatitis C     History reviewed. No pertinent past surgical history.  No family history on file.  History  Substance Use Topics  . Smoking status: Current Every Day Smoker -- 1.0 packs/day    Types: Cigarettes  . Smokeless tobacco: Not on file  . Alcohol Use: No   No OB history available.   Review of Systems  Constitutional: Negative for fever.  Respiratory: Negative for shortness of breath.   Cardiovascular: Negative for chest pain.  Gastrointestinal: Positive for nausea.  Genitourinary: Negative for vaginal bleeding.  Musculoskeletal: Negative for back pain.  Neurological: Positive for dizziness, weakness and light-headedness. Negative for headaches.  All other systems reviewed and are negative.    Allergies   Review of patient's allergies indicates no known allergies.  Home Medications   Current Outpatient Rx  Name  Route  Sig  Dispense  Refill  . ACETAMINOPHEN 500 MG PO TABS   Oral   Take 500 mg by mouth every 6 (six) hours as needed. Pain         . BENZTROPINE MESYLATE 0.5 MG PO TABS   Oral   Take 0.5 mg by mouth 2 (two) times daily.         Marland Kitchen HYPROMELLOSE 2.5 % OP SOLN   Both Eyes   Place 1 drop into both eyes daily as needed. Dry Eyes         . LITHIUM CARBONATE ER 300 MG PO TBCR   Oral   Take 300 mg by mouth 2 (two) times daily.         Marland Kitchen OLANZAPINE 15 MG PO TABS   Oral   Take 7.5 mg by mouth at bedtime.           Triage Vitals: BP 141/75  Pulse 82  Temp 98.3 F (36.8 C)  Resp 20  Ht 5\' 6"  (1.676 m)  Wt 135 lb (61.236 kg)  BMI 21.79 kg/m2  SpO2 99%  Physical Exam  CONSTITUTIONAL: Well developed/well nourished HEAD AND FACE: Normocephalic/atraumatic EYES: EOMI/PERRL ENMT: Mucous membranes dry NECK: supple no meningeal signs SPINE:entire spine nontender CV: S1/S2 noted, no murmurs/rubs/gallops noted LUNGS: Lungs are clear to auscultation bilaterally, no apparent distress ABDOMEN: soft, nontender, no rebound or guarding GU:no cva tenderness NEURO: Pt is awake/alert,  moves all extremitiesx4, no ataxia EXTREMITIES: pulses normal, full ROM SKIN: warm, color normal PSYCH: no abnormalities of mood noted  ED Course  Procedures  CRITICAL CARE Performed by: Joya Gaskins   Total critical care time: 31  Critical care time was exclusive of separately billable procedures and treating other patients.  Critical care was necessary to treat or prevent imminent or life-threatening deterioration.  Critical care was time spent personally by me on the following activities: development of treatment plan with patient and/or surrogate as well as nursing, discussions with consultants, evaluation of patient's response to treatment, examination of patient,  obtaining history from patient or surrogate, ordering and performing treatments and interventions, ordering and review of laboratory studies, ordering and review of radiographic studies, pulse oximetry and re-evaluation of patient's condition.   DIAGNOSTIC STUDIES: Oxygen Saturation is 99% on room air, normal by my interpretation.    COORDINATION OF CARE:  11:28 AM: Discussed treatment plan which includes blood work with pt at bedside and pt agreed to plan.  1:06 PM Pt found to have new onset diabetes and is in DKA Lithium level normal Reports for weeks she has felt nausea, fatigue.   Will admit for IV fluids and insulin drip.  Will need stepdown monitoring   Labs Reviewed  BASIC METABOLIC PANEL  LITHIUM LEVEL     MDM  Nursing notes including past medical history and social history reviewed and considered in documentation Labs/vital reviewed and considered       I personally performed the services described in this documentation, which was scribed in my presence. The recorded information has been reviewed and is accurate.      Joya Gaskins, MD 01/22/12 [phone]

## 2012-01-22 NOTE — ED Notes (Signed)
Pt sent from [facility] to have labs drawn for screening. Concerned about lithium level and kidney function.

## 2012-01-22 NOTE — H&P (Signed)
[facility]  History and Physical   [name] EXB:[id number] DOB: 01-18-1961 DOA: 01/22/2012   Referring physician: Dr. Bebe Shaggy  PCP: No primary provider on file.  Specialists: [facility] for psychiatry   Chief Complaint: abnormal labs   HPI: [name] is a 51 y.o. female who has a history of bipolar disorder and was sent to the emergency room for routine lab work. It was felt that her lithium level may be high, and she was sent to the emergency room by her psychiatrist. Patient was found to have elevated glucose and a significant metabolic acidosis. On further questioning, she admits to having increased thirst for the past 2 weeks. She's also had frequent urination and occasional blurry vision. Patient has a history of gestational diabetes in the past. She reports the only new medication that she's been started on was Cogentin. She's not had any fever, cough, shortness of breath, dysuria. She's had dry heaves but no vomiting. She has not had any abdominal cramping or diarrhea. Her by mouth intake has been very poor for the past few days. She was given IV fluids and started on an insulin infusion. She was referred for admission.   Review of Systems: pertinent positives as per HPI, otherwise negative   Past Medical History   Diagnosis  Date   .  Bipolar 1 disorder    .  Hepatitis C     History reviewed. No pertinent past surgical history.   Social History: reports that she has been smoking Cigarettes. She has been smoking about 1 pack per day. She does not have any smokeless tobacco history on file. She reports that she does not drink alcohol or use illicit drugs.   No Known Allergies   Family History: Patient's aunt and uncle have a history of diabetes. No diabetes in her parents.   Prior to Admission medications   Medication  Sig  Start Date  End Date  Taking?  Authorizing Provider   acetaminophen (TYLENOL) 500 MG tablet  Take 500 mg by mouth every 6 (six) hours as needed.  Pain    Yes  Historical Provider, MD   benztropine (COGENTIN) 0.5 MG tablet  Take 0.5 mg by mouth 2 (two) times daily.    Yes  Historical Provider, MD   hydroxypropyl methylcellulose (ISOPTO TEARS) 2.5 % ophthalmic solution  Place 1 drop into both eyes daily as needed. Dry Eyes    Yes  Historical Provider, MD   lithium carbonate (LITHOBID) 300 MG CR tablet  Take 300 mg by mouth 2 (two) times daily.  12/02/11   Yes  Neil Mashburn, PA-C   OLANZapine (ZYPREXA) 15 MG tablet  Take 7.5 mg by mouth at bedtime.    Yes  Historical Provider, MD    Physical Exam:  Filed Vitals:    01/22/12 1300  01/22/12 1311  01/22/12 1314  01/22/12 1348   BP:   131/64   121/57   Pulse:  84   84  80   Temp:       TempSrc:     Oral   Resp:    21  17   Height:     5\' 6"  (1.676 m)   Weight:     61.1 kg (134 lb 11.2 oz)   SpO2:  100%   99%  100%    General: NAD  Eyes: PERRLA  ENT: dry mucous membranes  Neck: supple  Cardiovascular: s1, s2, rrr  Respiratory: cta b  Abdomen: soft, nt,  nd, bs+  Skin: normal  Musculoskeletal: deferred  Psychiatric: anxious, co operative with exam  Neurologic: grossly intact, non focal  Labs on Admission:   Basic Metabolic Panel:   Lab  01/22/12 1005   NA  130*   K  3.4*   CL  94*   CO2  13*   GLUCOSE  344*   BUN  8   CREATININE  0.57   CALCIUM  9.1   MG  --   PHOS  --    Liver Function Tests:  No results found for this basename: AST:5,ALT:5,ALKPHOS:5,BILITOT:5,PROT:5,ALBUMIN:5 in the last 168 hours  No results found for this basename: LIPASE:5,AMYLASE:5 in the last 168 hours  No results found for this basename: AMMONIA:5 in the last 168 hours  CBC:   Lab  01/22/12 1136   WBC  9.7   NEUTROABS  --   HGB  13.8   HCT  39.0   MCV  93.1   PLT  135*    Cardiac Enzymes:  No results found for this basename: CKTOTAL:5,CKMB:5,CKMBINDEX:5,TROPONINI:5 in the last 168 hours  BNP (last 3 results)  No results found for this basename: PROBNP:3 in the last 8760 hours   CBG:   Lab  01/22/12 1259   GLUCAP  272*    Radiological Exams on Admission:  No results found.   Assessment/Plan  Principal Problem:  *DKA (diabetic ketoacidoses)  Active Problems:  Bipolar disorder, current episode manic severe with psychotic features  Diabetes mellitus  Hyponatremia   1. Diabetic ketoacidosis. The patient has significant metabolic acidosis. Her blood sugars elevated. ABG shows the metabolic acidosis with a pH of 7.29. Patient has been started on IV fluids and insulin drip per protocol. We will check serum ketones, serum lactic acid. We'll also check hemoglobin A1c. Patient will be transitioned to subcutaneous insulin once her anion gap has closed. She may be able to maintain her blood sugars on oral hypoglycemics. We'll followup on A1c. Lithium does cause hyperglycemia. Although this is not a new medication for her. Considering that the patient does have significant bipolar, it is likely more beneficial to continue lithium then discontinuing it at this point. Her levels are in normal range. We will also check serum lipase and LFTs. Patient will need diabetic teaching. We will continue IV fluids, follow serum chemistries closely.  Code Status: Full code  Family Communication: Discussed with patient  Disposition Plan: Discharge home once improved   Time spent: 45 minutes   [name]  [facility]  Pager [phone]   If 7PM-7AM, please contact night-coverage  [email or web address]  Password [email or web address]  01/22/2012, 4:12 PM

## 2012-01-23 ENCOUNTER — Inpatient Hospital Stay (HOSPITAL_COMMUNITY): Payer: Self-pay

## 2012-01-23 DIAGNOSIS — B1920 Unspecified viral hepatitis C without hepatic coma: Secondary | ICD-10-CM | POA: Diagnosis present

## 2012-01-23 DIAGNOSIS — R0902 Hypoxemia: Secondary | ICD-10-CM | POA: Diagnosis present

## 2012-01-23 DIAGNOSIS — R7401 Elevation of levels of liver transaminase levels: Secondary | ICD-10-CM

## 2012-01-23 DIAGNOSIS — R748 Abnormal levels of other serum enzymes: Secondary | ICD-10-CM | POA: Diagnosis present

## 2012-01-23 LAB — GLUCOSE, CAPILLARY
Glucose-Capillary: 130 mg/dL — ABNORMAL HIGH (ref 70–99)
Glucose-Capillary: 133 mg/dL — ABNORMAL HIGH (ref 70–99)
Glucose-Capillary: 146 mg/dL — ABNORMAL HIGH (ref 70–99)
Glucose-Capillary: 146 mg/dL — ABNORMAL HIGH (ref 70–99)
Glucose-Capillary: 153 mg/dL — ABNORMAL HIGH (ref 70–99)
Glucose-Capillary: 176 mg/dL — ABNORMAL HIGH (ref 70–99)
Glucose-Capillary: 180 mg/dL — ABNORMAL HIGH (ref 70–99)
Glucose-Capillary: 190 mg/dL — ABNORMAL HIGH (ref 70–99)
Glucose-Capillary: 196 mg/dL — ABNORMAL HIGH (ref 70–99)
Glucose-Capillary: 237 mg/dL — ABNORMAL HIGH (ref 70–99)
Glucose-Capillary: 253 mg/dL — ABNORMAL HIGH (ref 70–99)
Glucose-Capillary: 370 mg/dL — ABNORMAL HIGH (ref 70–99)

## 2012-01-23 LAB — COMPREHENSIVE METABOLIC PANEL
ALT: 116 U/L — ABNORMAL HIGH (ref 0–35)
AST: 148 U/L — ABNORMAL HIGH (ref 0–37)
Albumin: 2.9 g/dL — ABNORMAL LOW (ref 3.5–5.2)
Alkaline Phosphatase: 80 U/L (ref 39–117)
BUN: 4 mg/dL — ABNORMAL LOW (ref 6–23)
CO2: 19 mEq/L (ref 19–32)
Calcium: 8.2 mg/dL — ABNORMAL LOW (ref 8.4–10.5)
Chloride: 107 mEq/L (ref 96–112)
Creatinine, Ser: 0.49 mg/dL — ABNORMAL LOW (ref 0.50–1.10)
GFR calc Af Amer: >90 mL/min (ref >90)
GFR calc non Af Amer: >90 mL/min (ref >90)
Glucose, Bld: 162 mg/dL — ABNORMAL HIGH (ref 70–99)
Potassium: 3.3 mEq/L — ABNORMAL LOW (ref 3.5–5.1)
Sodium: 134 mEq/L — ABNORMAL LOW (ref 135–145)
Total Bilirubin: 1.3 mg/dL — ABNORMAL HIGH (ref 0.3–1.2)
Total Protein: 5.2 g/dL — ABNORMAL LOW (ref 6.0–8.3)

## 2012-01-23 LAB — CBC
HCT: 31.6 % — ABNORMAL LOW (ref 36.0–46.0)
Hemoglobin: 11.3 g/dL — ABNORMAL LOW (ref 12.0–15.0)
MCH: 33 pg (ref 26.0–34.0)
MCHC: 35.8 g/dL (ref 30.0–36.0)
MCV: 92.4 fL (ref 78.0–100.0)
Platelets: 94 10*3/uL — ABNORMAL LOW (ref 150–400)
RBC: 3.42 MIL/uL — ABNORMAL LOW (ref 3.87–5.11)
RDW: 13.3 % (ref 11.5–15.5)
WBC: 6.5 10*3/uL (ref 4.0–10.5)

## 2012-01-23 LAB — TROPONIN I: Troponin I: <0.3 ng/mL (ref <0.30)

## 2012-01-23 MED ORDER — INSULIN ASPART 100 UNIT/ML ~~LOC~~ SOLN
0.0000 [IU] | Freq: Three times a day (TID) | SUBCUTANEOUS | Status: DC
Start: 2012-01-23 — End: 2012-01-24
  Administered 2012-01-23: 5 [IU] via SUBCUTANEOUS
  Administered 2012-01-23: 3 [IU] via SUBCUTANEOUS
  Administered 2012-01-24: 8 [IU] via SUBCUTANEOUS

## 2012-01-23 MED ORDER — INSULIN ASPART 100 UNIT/ML ~~LOC~~ SOLN
0.0000 [IU] | Freq: Every day | SUBCUTANEOUS | Status: DC
  Administered 2012-01-23: 5 [IU] via SUBCUTANEOUS

## 2012-01-23 MED ORDER — GLYBURIDE 5 MG PO TABS
5.0000 mg | ORAL_TABLET | Freq: Two times a day (BID) | ORAL | Status: DC
  Administered 2012-01-23 – 2012-01-24 (×2): 5 mg via ORAL
  Filled 2012-01-23 (×2): qty 1

## 2012-01-23 MED ORDER — FLEET ENEMA 7-19 GM/118ML RE ENEM
1.0000 | ENEMA | Freq: Once | RECTAL | Status: AC
  Administered 2012-01-23: 1 via RECTAL

## 2012-01-23 MED ORDER — POTASSIUM CHLORIDE CRYS ER 20 MEQ PO TBCR
40.0000 meq | EXTENDED_RELEASE_TABLET | Freq: Once | ORAL | Status: AC
  Administered 2012-01-23: 40 meq via ORAL
  Filled 2012-01-23: qty 2

## 2012-01-23 MED ORDER — METFORMIN HCL 500 MG PO TABS
1000.0000 mg | ORAL_TABLET | Freq: Two times a day (BID) | ORAL | Status: DC
  Administered 2012-01-23 – 2012-01-24 (×2): 1000 mg via ORAL
  Filled 2012-01-23 (×2): qty 2

## 2012-01-23 NOTE — Progress Notes (Addendum)
[facility]             Progress Note   Subjective: Patient feels about the same. She denies any nausea or vomiting. By mouth intake has been adequate. She's not had a bowel movement. She does feel that her abdomen has become distended. She denies any shortness of breath.  Objective: Vital signs in last 24 hours: Temp:  [98 F (36.7 C)-98.7 F (37.1 C)] 98.7 F (37.1 C) (12/21 0400) Pulse Rate:  [75-98] 98  (12/21 0800) Resp:  [11-23] 20  (12/21 0800) BP: (84-141)/(49-75) 120/66 mmHg (12/21 0800) SpO2:  [82 %-100 %] 95 % (12/21 0800) Weight:  [61.1 kg (134 lb 11.2 oz)-62.9 kg (138 lb 10.7 oz)] 62.9 kg (138 lb 10.7 oz) (12/21 0500) Weight change:  Last BM Date: 01/17/12  Intake/Output from previous day: 12/20 0701 - 12/21 0700 In: 2550.4 [P.O.:960.8; I.V.:1289.6; IV Piggyback:300] Out: 1200 [Urine:1200] Total I/O In: 221.4 [I.V.:221.4] Out: -    Physical Exam: General: Alert, awake, oriented x3, in no acute distress. HEENT: No bruits, no goiter. Heart: Regular rate and rhythm, without murmurs, rubs, gallops. Lungs: Crackles at bases bilaterally Abdomen: Soft, nontender, distended, positive bowel sounds. Extremities: No clubbing cyanosis or edema with positive pedal pulses. Neuro: Grossly intact, nonfocal.    Lab Results: Basic Metabolic Panel:  Basename 01/23/12 0430 01/22/12 2220 01/22/12 1800  NA 134* 131* --  K 3.3* 3.7 --  CL 107 101 --  CO2 19 17* --  GLUCOSE 162* 230* --  BUN 4* 5* --  CREATININE 0.49* 0.55 --  CALCIUM 8.2* 8.0* --  MG -- -- 1.7  PHOS -- -- --   Liver Function Tests:  [facility] 01/23/12 0430  AST 148*  ALT 116*  ALKPHOS 80  BILITOT 1.3*  PROT 5.2*  ALBUMIN 2.9*    Basename 01/22/12 1614  LIPASE 48  AMYLASE --   No results found for this basename: AMMONIA:2 in the last 72 hours CBC:  Basename 01/23/12 0430 01/22/12 1136  WBC 6.5 9.7  NEUTROABS -- --  HGB 11.3* 13.8  HCT 31.6* 39.0  MCV 92.4 93.1  PLT  94* 135*   Cardiac Enzymes:  Basename 01/23/12 0430 01/22/12 2220 01/22/12 1614  CKTOTAL -- -- --  CKMB -- -- --  CKMBINDEX -- -- --  TROPONINI <0.30 <0.30 <0.30   BNP: No results found for this basename: PROBNP:3 in the last 72 hours D-Dimer: No results found for this basename: DDIMER:2 in the last 72 hours CBG:  Basename 01/23/12 0848 01/23/12 0744 01/23/12 0639 01/23/12 0529 01/23/12 0316 01/23/12 0204  GLUCAP 253* 153* 196* 180* 146*146* 133*   Hemoglobin A1C: No results found for this basename: HGBA1C in the last 72 hours Fasting Lipid Panel: No results found for this basename: CHOL,HDL,LDLCALC,TRIG,CHOLHDL,LDLDIRECT in the last 72 hours Thyroid Function Tests: No results found for this basename: TSH,T4TOTAL,FREET4,T3FREE,THYROIDAB in the last 72 hours Anemia Panel: No results found for this basename: VITAMINB12,FOLATE,FERRITIN,TIBC,IRON,RETICCTPCT in the last 72 hours Coagulation: No results found for this basename: LABPROT:2,INR:2 in the last 72 hours Urine Drug Screen: Drugs of Abuse     Component Value Date/Time   LABOPIA NONE DETECTED 11/21/2011 1526   COCAINSCRNUR NONE DETECTED 11/21/2011 1526   LABBENZ NONE DETECTED 11/21/2011 1526   AMPHETMU NONE DETECTED 11/21/2011 1526   THCU NONE DETECTED 11/21/2011 1526   LABBARB NONE DETECTED 11/21/2011 1526    Alcohol Level: No results found for this basename: ETH:2 in the last 72 hours Urinalysis:  [employer]  01/22/12 1240  COLORURINE YELLOW  LABSPEC 1.015  PHURINE 6.0  GLUCOSEU >1000*  HGBUR NEGATIVE  BILIRUBINUR NEGATIVE  KETONESUR >80*  PROTEINUR NEGATIVE  UROBILINOGEN 0.2  NITRITE NEGATIVE  LEUKOCYTESUR NEGATIVE    Recent Results (from the past 240 hour(s))  MRSA PCR SCREENING     Status: Normal   Collection Time   01/22/12  2:25 PM      Component Value Range Status Comment   MRSA by PCR NEGATIVE  NEGATIVE Final     Studies/Results: No results found.  Medications: Scheduled Meds:   .  benztropine  0.5 mg Oral BID  . enoxaparin (LOVENOX) injection  40 mg Subcutaneous Q24H  . lithium carbonate  300 mg Oral BID  . nicotine  14 mg Transdermal Daily  . OLANZapine  7.5 mg Oral QHS  . polyethylene glycol  17 g Oral Daily   Continuous Infusions:   . sodium chloride 1,000 mL (01/22/12 2111)  . dextrose 5 % and 0.45% NaCl 125 mL/hr at 01/23/12 0800  . insulin (NOVOLIN-R) infusion 7.7 mL/hr at 01/23/12 0800   PRN Meds:.acetaminophen, acetaminophen, dextrose, ondansetron (ZOFRAN) IV, ondansetron, polyvinyl alcohol  Assessment/Plan:  Principal Problem:  *DKA (diabetic ketoacidoses) Active Problems:  Bipolar disorder, current episode manic severe with psychotic features  Diabetes mellitus  Hyponatremia  Hepatitis C  Elevated liver enzymes  Hypoxia  1. Diabetic ketoacidosis. New diagnosis of diabetes. Anion gap is closed. Blood sugars are in goal range. Hemoglobin A1c is pending. We'll start the patient on metformin twice a day as well as glyburide. Will continue her on signs to insulin with frequent CBG checks. Continue to monitor her blood sugars today.  2. Bipolar disorder. Continue outpatient regimen.  3. Hyponatremia. Improved with IV fluids.  4. Elevated liver enzymes. Possibly due to history of hepatitis C. We'll check right upper quadrant ultrasound to rule out any biliary process.  5. Hypoxia. Patient was placed on oxygen last name in her oxygen saturations are in the high 80s. This was possibly due to volume overload due to aggressive fluid hydration. We will stop IV fluids, check chest x-ray.  6. Thrombocytopenia. Possibly related to underlying liver disease. She does not appear to be toxic. May also be dilutional since the patient was dehydrated on admission. We will continue to follow. Hold lovenox for now and use SCDs for DVT prophylaxis.  7. Disposition. The patient will be transferred to telemetry. If blood sugars are stable, and hypoxia improves, then  possible discharge tomorrow.  Time spent coordinating care:   LOS: 1 day   Auset Fritzler [facility] Pager: [phone] 01/23/2012, 9:19 AM

## 2012-01-23 NOTE — Progress Notes (Signed)
Patient transferred to room 323. Report given to Zannie Kehr RN.

## 2012-01-24 ENCOUNTER — Encounter (HOSPITAL_COMMUNITY): Payer: Self-pay | Admitting: Internal Medicine

## 2012-01-24 DIAGNOSIS — E119 Type 2 diabetes mellitus without complications: Secondary | ICD-10-CM

## 2012-01-24 DIAGNOSIS — K760 Fatty (change of) liver, not elsewhere classified: Secondary | ICD-10-CM

## 2012-01-24 DIAGNOSIS — K7689 Other specified diseases of liver: Secondary | ICD-10-CM

## 2012-01-24 DIAGNOSIS — B1920 Unspecified viral hepatitis C without hepatic coma: Secondary | ICD-10-CM

## 2012-01-24 HISTORY — DX: Fatty (change of) liver, not elsewhere classified: K76.0

## 2012-01-24 HISTORY — DX: Type 2 diabetes mellitus without complications: E11.9

## 2012-01-24 LAB — COMPREHENSIVE METABOLIC PANEL
ALT: 123 U/L — ABNORMAL HIGH (ref 0–35)
AST: 146 U/L — ABNORMAL HIGH (ref 0–37)
Albumin: 3 g/dL — ABNORMAL LOW (ref 3.5–5.2)
Alkaline Phosphatase: 92 U/L (ref 39–117)
BUN: 4 mg/dL — ABNORMAL LOW (ref 6–23)
CO2: 25 mEq/L (ref 19–32)
Calcium: 8.8 mg/dL (ref 8.4–10.5)
Chloride: 103 mEq/L (ref 96–112)
Creatinine, Ser: 0.58 mg/dL (ref 0.50–1.10)
GFR calc Af Amer: >90 mL/min (ref >90)
GFR calc non Af Amer: >90 mL/min (ref >90)
Glucose, Bld: 306 mg/dL — ABNORMAL HIGH (ref 70–99)
Potassium: 3.6 mEq/L (ref 3.5–5.1)
Sodium: 135 mEq/L (ref 135–145)
Total Bilirubin: 1.4 mg/dL — ABNORMAL HIGH (ref 0.3–1.2)
Total Protein: 5.5 g/dL — ABNORMAL LOW (ref 6.0–8.3)

## 2012-01-24 LAB — GLUCOSE, CAPILLARY
Glucose-Capillary: 297 mg/dL — ABNORMAL HIGH (ref 70–99)
Glucose-Capillary: 329 mg/dL — ABNORMAL HIGH (ref 70–99)

## 2012-01-24 LAB — MAGNESIUM: Magnesium: 1.9 mg/dL (ref 1.5–2.5)

## 2012-01-24 MED ORDER — METFORMIN HCL 500 MG PO TABS: 500.0000 mg | ORAL_TABLET | Freq: Two times a day (BID) | ORAL | Status: DC

## 2012-01-24 MED ORDER — INSULIN NPH (HUMAN) (ISOPHANE) 100 UNIT/ML ~~LOC~~ SUSP: 15.0000 [IU] | Freq: Every day | SUBCUTANEOUS | Status: DC

## 2012-01-24 MED ORDER — GLIPIZIDE 10 MG PO TABS: 5.0000 mg | ORAL_TABLET | Freq: Two times a day (BID) | ORAL | Status: DC

## 2012-01-24 MED ORDER — POLYETHYLENE GLYCOL 3350 17 G PO PACK: 17.0000 g | PACK | Freq: Every day | ORAL | Status: DC | PRN

## 2012-01-24 NOTE — Discharge Summary (Signed)
Physician Discharge Summary  [name] ZOX:[id number] DOB: 1960-07-16 DOA: 01/22/2012  PCP: [facility].  Admit date: 01/22/2012 Discharge date: 01/24/2012  Time spent: Greater than 30 minutes  Recommendations for Outpatient Follow-up:  1. The patient was instructed to followup with her primary care provider at [facility] in 1 week or less.  Discharge Diagnoses:  1. Newly diagnosed type 2 diabetes mellitus with DKA. 2. History of gestational diabetes. 3. Hepatitis C. Status post (failed) treatment at [facility]. 4. Fatty liver, per abdominal ultrasound. 5. Elevated liver transaminases, secondary to hepatitis C and fatty liver. 6. Hyponatremia secondary to uncontrolled diabetes mellitus. 7. Transient hypoxia, likely secondary to atelectasis and small bilateral pleural effusions.. 8. Hypokalemia. Supplemented and resolved. 9. Thrombocytopenia, secondary to chronic hepatitis C and in part hemodilution. 8. Bipolar disorder with a history of psychotic features. Stable and asymptomatic during the hospitalization.  Discharge Condition: Improved.  Diet recommendation: Carbohydrate modified.  Filed Weights   01/22/12 1037 01/22/12 1348 01/23/12 0500  Weight: 61.236 kg (135 lb) 61.1 kg (134 lb 11.2 oz) 62.9 kg (138 lb 10.7 oz)    History of present illness:  The patient is a 51 year old with a history significant for chronic hepatitis C, bipolar disorder, and gestational diabetes, who presented to the emergency department by her psychiatrist on 01/22/2012 for evaluation of abnormal outpatient laboratory studies. Initially, it was felt that the patient's lithium level may be too high given her symptoms of dry heaving, frequent urination and blurred vision. However, when she was initially evaluated, she was noted to have a glucose of 344, CO2 of 13, and serum sodium of 130. Her urinalysis revealed greater than 1000 glucose and greater than 80 ketones. Her urine pregnancy  test was negative. Her lithium level was therapeutic at 0.84. She was admitted for further evaluation and management.  Hospital Course:  The patient was clinically diagnosed with diabetes mellitus. Given her age, she was diagnosed with type 2 diabetes mellitus with the presentation of DKA. She reported a history of gestational diabetes, and therefore, she was familiar with treatment, but she was not familiar with what DKA was. She was started on the insulin drip Glucomander protocol. IV fluids were started with normal saline. She was continued on her chronic psychotropic medications. Diabetes teaching was given. The registered dietitian and registered nurse educated the patient on diabetes, self insulin administration, and glucose monitoring. She was shown Systems developer.  With treatment of the DKA, the patient's serum potassium fell to 2.6. She was repleted with potassium chloride in the IV fluids and eventually orally. Additional studies were ordered. Her troponin I was within normal limits. Her lactic acid level was within normal limits at 0.9. Her anion gap closed. Following the resolution of DKA, she was started on glyburide, metformin, sliding scale NovoLog, and Levemir. Her blood glucose improved, but was not optimally controlled. However, with time and continued treatment with oral hypoglycemics and insulin, her capillary blood glucose is expected to improve.  The patient has a history of hepatitis C. She reported treatment with what sounds like interferon at [facility]. She stated that it was thought that the treatment was not effective. She may be tried on another medication soon. Her liver transaminases were elevated. Her SG OT range from 146 to 148 and ALT ranged from 116 to 123. Her total bilirubin was 1.3-1.4. Ultrasound of her abdomen was ordered for further evaluation. It revealed an echogenic liver consistent with fatty liver. There was no evidence of gallstones.  The patient became  transiently hypoxic with oxygen saturation in the 80s. This was thought to be secondary to atelectasis or volume overload from IV fluids. A chest x-ray was ordered. It revealed small bilateral pleural effusions, but no heart failure or infiltrates. The patient's oxygen saturation improved 100% on nasal cannula oxygen and 95% on room air prior to discharge.  The patient improved clinically and symptomatically. Her diet was advanced which she tolerated well. At the time of discharge, she was instructed on her diabetes regimen which included metformin, glipizide, and NPH. The dose of metformin was decreased given her liver disease. NPH instead of Levemir or Lantus was ordered due to the less expensive cost. The patient was instructed to follow a carbohydrate modified diet as closely as possible. She was instructed to check her blood sugars at home at least twice daily. She was instructed to followup with her primary care provider in one week or less.  Procedures:  None  Consultations:  None  Discharge Exam: Filed Vitals:   01/23/12 1435 01/23/12 2053 01/24/12 0224 01/24/12 0605  BP: 80/55 112/64 126/53 107/60  Pulse: 83 97 92 88  Temp: 98 F (36.7 C) 98.4 F (36.9 C) 98.5 F (36.9 C) 97.9 F (36.6 C)  TempSrc: Oral Oral Oral Oral  Resp: 20 20    Height:      Weight:      SpO2: 100% 100% 97% 100%    General: Alert 51 year old Caucasian woman sitting up in bed, in no acute distress. Cardiovascular: S1, S2, with no murmurs rubs or gallops. Respiratory: Clear to auscultation bilaterally. Abdomen: Positive bowel sounds, soft, nontender, nondistended.  Discharge Instructions  Discharge Orders    Future Orders Please Complete By Expires   Diet Carb Modified      Increase activity slowly      Discharge instructions      Comments:   Check your blood sugars at least twice daily. Do not take glipizide if your blood sugar is less than 120. Followup with your health care provider in 3-7  days.       Medication List     As of 01/24/2012  3:01 PM    TAKE these medications         acetaminophen 500 MG tablet   Commonly known as: TYLENOL   Take 500 mg by mouth every 6 (six) hours as needed. Pain      benztropine 0.5 MG tablet   Commonly known as: COGENTIN   Take 0.5 mg by mouth 2 (two) times daily.      glipiZIDE 10 MG tablet   Commonly known as: GLUCOTROL   Take 0.5 tablets (5 mg total) by mouth 2 (two) times daily before a meal. For diabetes. Take a half a tablet twice a day. Do not take it if your blood sugar is less than 120.      hydroxypropyl methylcellulose 2.5 % ophthalmic solution   Commonly known as: ISOPTO TEARS   Place 1 drop into both eyes daily as needed. Dry Eyes      insulin NPH 100 UNIT/ML injection   Commonly known as: HUMULIN N,NOVOLIN N   Inject 15 Units into the skin at bedtime.      lithium carbonate 300 MG CR tablet   Commonly known as: LITHOBID   Take 300 mg by mouth 2 (two) times daily.      metFORMIN 500 MG tablet   Commonly known as: GLUCOPHAGE   Take 1 tablet (500 mg total)  by mouth 2 (two) times daily with a meal. For treatment of diabetes.      OLANZapine 15 MG tablet   Commonly known as: ZYPREXA   Take 7.5 mg by mouth at bedtime.      polyethylene glycol packet   Commonly known as: MIRALAX / GLYCOLAX   Take 17 g by mouth daily as needed (For constipation).           Follow-up Information    Please follow up. (Followup with your healthcare provider at [facility] in 3-7 days.)           The results of significant diagnostics from this hospitalization (including imaging, microbiology, ancillary and laboratory) are listed below for reference.    Significant Diagnostic Studies: Dg Chest 2 View  01/23/2012  *RADIOLOGY REPORT*  Clinical Data: Hypoxia  CHEST - 2 VIEW  Comparison: 08/20/2005  Findings: Cardiac size and pulmonary vascularity are normal. Negative for heart failure or edema.  There is no  infiltrate. There are minimal pleural effusions bilaterally.  There is apical emphysema.  IMPRESSION: Small bilateral pleural effusions.  Negative for heart failure or pneumonia.   Original Report Authenticated By: Janeece Riggers, M.D.    US Abdomen Limited Ruq  01/23/2012  *RADIOLOGY REPORT*  Clinical Data:  Elevated liver function test, history of hepatitis C  LIMITED ABDOMINAL ULTRASOUND - RIGHT UPPER QUADRANT  Comparison:  None.  Findings:  Gallbladder:  No definite gallstones are seen.  There appears to be mild sludge within the gallbladder.  There is no pain over the gallbladder with compression.  Common bile duct:  The common bile duct is normal measuring 4.9 mm in diameter.  Liver:  The liver is diffusely echogenic consistent with fatty infiltration.  No focal abnormality is seen.  IMPRESSION:  1.  Echogenic liver consistent with fatty infiltration.  No ductal dilatation. 2.  No gallstones.  Some gallbladder sludge is noted.                    Original Report Authenticated By: Dwyane Dee, M.D.     Microbiology: Recent Results (from the past 240 hour(s))  MRSA PCR SCREENING     Status: Normal   Collection Time   01/22/12  2:25 PM      Component Value Range Status Comment   MRSA by PCR NEGATIVE  NEGATIVE Final      Labs: Basic Metabolic Panel:  Lab 01/24/12 2956 01/23/12 0430 01/22/12 2220 01/22/12 2010 01/22/12 1800  NA 135 134* 131* 132* 131*  K 3.6 3.3* 3.7 2.9* 2.7*  CL 103 107 101 100 97  CO2 25 19 17* 18* 15*  GLUCOSE 306* 162* 230* 166* 310*  BUN 4* 4* 5* 5* 5*  CREATININE 0.58 0.49* 0.55 0.58 0.56  CALCIUM 8.8 8.2* 8.0* 8.0* 7.8*  MG 1.9 -- -- -- 1.7  PHOS -- -- -- -- --   Liver Function Tests:  Lab 01/24/12 0705 01/23/12 0430  AST 146* 148*  ALT 123* 116*  ALKPHOS 92 80  BILITOT 1.4* 1.3*  PROT 5.5* 5.2*  ALBUMIN 3.0* 2.9*    Lab 01/22/12 1614  LIPASE 48  AMYLASE --   No results found for this basename: AMMONIA:5 in the last 168 hours CBC:  Lab 01/23/12  0430 01/22/12 1136  WBC 6.5 9.7  NEUTROABS -- --  HGB 11.3* 13.8  HCT 31.6* 39.0  MCV 92.4 93.1  PLT 94* 135*   Cardiac Enzymes:  Lab 01/23/12 0430 01/22/12  2220 01/22/12 1614  CKTOTAL -- -- --  CKMB -- -- --  CKMBINDEX -- -- --  TROPONINI <0.30 <0.30 <0.30   BNP: BNP (last 3 results) No results found for this basename: PROBNP:3 in the last 8760 hours CBG:  Lab 01/24/12 1107 01/24/12 0730 01/23/12 2056 01/23/12 1630 01/23/12 1407  GLUCAP 329* 297* 370* 176* 237*       Signed:  Archie Shea  [facility] 01/24/2012, 3:01 PM

## 2012-01-24 NOTE — Progress Notes (Signed)
Pt discharged with instructions, prescriptions, and care notes.  Pt verbalized understanding.  Prior to d/c pt administered  her own insulin injection. She stated that she knew how to prepare the medication due to she had to take insulin when she was pregnant.  Pt left the floor via w/c with staff and family in stable condition.  All questions and concerns were addressed.

## 2012-01-25 LAB — GLUCOSE, CAPILLARY: Glucose-Capillary: 152 mg/dL — ABNORMAL HIGH (ref 70–99)

## 2012-01-28 NOTE — Progress Notes (Signed)
UR Chart Review Completed  

## 2012-02-08 LAB — TSH: TSH: 3.209 u[IU]/mL

## 2012-02-08 LAB — HEMOGLOBIN A1C
Hgb A1c MFr Bld: 12.2 %
Mean Plasma Glucose: 303 mg/dL

## 2015-10-08 ENCOUNTER — Emergency Department (HOSPITAL_COMMUNITY)
Admission: EM | Admit: 2015-10-08 | Discharge: 2015-10-08 | Disposition: A | Discharge status: Home / Self Care | Payer: Self-pay | Attending: Emergency Medicine | Admitting: Emergency Medicine

## 2015-10-08 ENCOUNTER — Encounter (HOSPITAL_COMMUNITY): Payer: Self-pay | Admitting: Emergency Medicine

## 2015-10-08 ENCOUNTER — Emergency Department (HOSPITAL_COMMUNITY): Payer: Self-pay

## 2015-10-08 DIAGNOSIS — R0602 Shortness of breath: Secondary | ICD-10-CM | POA: Insufficient documentation

## 2015-10-08 DIAGNOSIS — Z87891 Personal history of nicotine dependence: Secondary | ICD-10-CM | POA: Insufficient documentation

## 2015-10-08 DIAGNOSIS — Z794 Long term (current) use of insulin: Secondary | ICD-10-CM | POA: Insufficient documentation

## 2015-10-08 DIAGNOSIS — J45909 Unspecified asthma, uncomplicated: Secondary | ICD-10-CM | POA: Insufficient documentation

## 2015-10-08 DIAGNOSIS — E119 Type 2 diabetes mellitus without complications: Secondary | ICD-10-CM | POA: Insufficient documentation

## 2015-10-08 DIAGNOSIS — Z7984 Long term (current) use of oral hypoglycemic drugs: Secondary | ICD-10-CM | POA: Insufficient documentation

## 2015-10-08 HISTORY — DX: Unspecified asthma, uncomplicated: J45.909

## 2015-10-08 LAB — CBG MONITORING, ED: Glucose-Capillary: 115 mg/dL — ABNORMAL HIGH (ref 65–99)

## 2015-10-08 MED ORDER — ALBUTEROL SULFATE (2.5 MG/3ML) 0.083% IN NEBU
5.0000 mg | INHALATION_SOLUTION | Freq: Once | RESPIRATORY_TRACT | Status: AC
  Administered 2015-10-08: 5 mg via RESPIRATORY_TRACT
  Filled 2015-10-08: qty 6

## 2015-10-08 NOTE — ED Triage Notes (Signed)
Pt reports sudden onset of SOB about 2-3 hours ago. PMH of asthma. States she just finished taking prednisone today.

## 2015-10-08 NOTE — ED Notes (Signed)
Pt taken to xray 

## 2015-10-08 NOTE — ED Provider Notes (Signed)
[facility]-EMERGENCY DEPT Provider Note   CSN: [id number] Arrival date & time: 10/08/15  1758     History   Chief Complaint Chief Complaint  Patient presents with  . Shortness of Breath    HPI [name] is a 55 y.o. female.  The history is provided by the patient.  Shortness of Breath  This is a new problem. The problem occurs intermittently.The current episode started more than 2 days ago. The problem has been gradually worsening. Pertinent negatives include no fever, no cough, no sputum production, no hemoptysis, no chest pain, no vomiting, no abdominal pain and no leg swelling. She has tried oral steroids for the symptoms. Associated medical issues include asthma. Associated medical issues do not include chronic lung disease, PE, CAD or DVT.  patient with h/o bipolar, diabetes reports for past week she has been feeling short of breath No cough No cp She denies h/o CAD/Pe/DVT She reports seen by PCP, placed on prednisone/albuterol with some relief However she just completed prednisone therapy and now shortness of breath is worse She is a smoker but she just quit   Past Medical History:  Diagnosis Date  . Asthma   . Bipolar 1 disorder (HCC)   . DKA (diabetic ketoacidoses) (HCC) 01/22/2012  . DM2 (diabetes mellitus, type 2) (HCC) 01/24/2012  . Fatty liver 01/24/2012  . Hepatitis C     Patient Active Problem List   Diagnosis Date Noted  . Fatty liver 01/24/2012  . DM2 (diabetes mellitus, type 2) (HCC) 01/24/2012  . Hepatitis C 01/23/2012  . Elevated liver enzymes 01/23/2012  . Hypoxia 01/23/2012  . DKA (diabetic ketoacidoses) (HCC) 01/22/2012  . Diabetes mellitus (HCC) 01/22/2012  . Hyponatremia 01/22/2012  . Bipolar disorder, current episode manic severe with psychotic features (HCC) 11/24/2011    Class: Chronic    History reviewed. No pertinent surgical history.  OB History    No data available       Home Medications    Prior to Admission medications     Medication Sig Start Date End Date Taking? Authorizing Provider  acetaminophen (TYLENOL) 500 MG tablet Take 500 mg by mouth every 6 (six) hours as needed. Pain    Historical Provider, MD  benztropine (COGENTIN) 0.5 MG tablet Take 0.5 mg by mouth 2 (two) times daily.    Historical Provider, MD  glipiZIDE (GLUCOTROL) 10 MG tablet Take 0.5 tablets (5 mg total) by mouth 2 (two) times daily before a meal. For diabetes. Take a half a tablet twice a day. Do not take it if your blood sugar is less than 120. 01/24/12   Elliot Cousinenise Fisher, MD  hydroxypropyl methylcellulose (ISOPTO TEARS) 2.5 % ophthalmic solution Place 1 drop into both eyes daily as needed. Dry Eyes    Historical Provider, MD  insulin NPH (NOVOLIN N) 100 UNIT/ML injection Inject 15 Units into the skin at bedtime. 01/24/12   Elliot Cousinenise Fisher, MD  lithium carbonate (LITHOBID) 300 MG CR tablet Take 300 mg by mouth 2 (two) times daily. 12/02/11   Tamala JulianNeil T Mashburn, PA-C  metFORMIN (GLUCOPHAGE) 500 MG tablet Take 1 tablet (500 mg total) by mouth 2 (two) times daily with a meal. For treatment of diabetes. 01/24/12   Elliot Cousinenise Fisher, MD  OLANZapine (ZYPREXA) 15 MG tablet Take 7.5 mg by mouth at bedtime.    Historical Provider, MD  polyethylene glycol (MIRALAX / GLYCOLAX) packet Take 17 g by mouth daily as needed (For constipation). 01/24/12   Elliot Cousinenise Fisher, MD    Family  History No family history on file.  Social History Social History  Substance Use Topics  . Smoking status: Former Smoker    Packs/day: 1.00    Types: Cigarettes    Quit date: 10/01/2015  . Smokeless tobacco: Never Used  . Alcohol use No     Allergies   Review of patient's allergies indicates no known allergies.   Review of Systems Review of Systems  Constitutional: Negative for fever.  Respiratory: Positive for shortness of breath. Negative for cough, hemoptysis and sputum production.   Cardiovascular: Negative for chest pain and leg swelling.  Gastrointestinal: Negative for  abdominal pain and vomiting.  Psychiatric/Behavioral: The patient is nervous/anxious.   All other systems reviewed and are negative.    Physical Exam Updated Vital Signs BP 101/62 (BP Location: Left Arm)   Pulse 72   Temp 97.8 F (36.6 C) (Oral)   Resp (!) 29   Ht 5\' 6"  (1.676 m)   Wt 54 kg   SpO2 100%   BMI 19.21 kg/m   Physical Exam CONSTITUTIONAL: Well developed, anxious HEAD: Normocephalic/atraumatic EYES: EOMI ENMT: Mucous membranes moist NECK: supple no meningeal signs SPINE/BACK:entire spine nontender CV: S1/S2 noted, no murmurs/rubs/gallops noted LUNGS: ?faint wheeze noted.  No crackles.  Mild tachypnea noted ABDOMEN: soft, nontender NEURO: Pt is awake/alert/appropriate, moves all extremitiesx4.  No facial droop.   EXTREMITIES: pulses normal/equal, full ROM, no LE edema noted, no calf tenderness noted SKIN: warm, color normal PSYCH: anxious   ED Treatments / Results  Labs (all labs ordered are listed, but only abnormal results are displayed) Labs Reviewed  CBG MONITORING, ED    EKG  EKG Interpretation  Date/Time:  Tuesday October 08 2015 19:56:49 EDT Ventricular Rate:  80 PR Interval:    QRS Duration: 89 QT Interval:  378 QTC Calculation: 436 R Axis:   43 Text Interpretation:  Sinus rhythm Abnormal R-wave progression, early transition ECG OTHERWISE WITHIN NORMAL LIMITS Confirmed by Bebe Shaggy  MD, Jamilyn Pigeon ([id number]) on 10/08/2015 8:02:31 PM       Radiology Dg Chest 2 View  Result Date: 10/08/2015 CLINICAL DATA:  55 year old female with shortness of breath. EXAM: CHEST  2 VIEW COMPARISON:  Radiograph dated 01/23/2012 FINDINGS: Two views of the chest demonstrate emphysematous changes of the lungs. There is no focal consolidation, pleural effusion, or pneumothorax. The cardiac silhouette is within normal limits. No acute osseous pathology. IMPRESSION: No active cardiopulmonary disease. Electronically Signed   By: Elgie Collard M.D.   On: 10/08/2015 19:19      Procedures Procedures (including critical care time)  Medications Ordered in ED Medications  albuterol (PROVENTIL) (2.5 MG/3ML) 0.083% nebulizer solution 5 mg (5 mg Nebulization Given 10/08/15 1823)     Initial Impression / Assessment and Plan / ED Course  I have reviewed the triage vital signs and the nursing notes.  Pertinent labs & imaging results that were available during my care of the patient were reviewed by me and considered in my medical decision making (see chart for details).  Clinical Course    PT STABLE IMPROVED IN THE ED NO HYPOXIA ON AMBULATION CXR NEGATIVE EKG UNREMARKABLE GLUCOSE APPROPRIATE I DOUBT CHF/PE AT THIS TIME SUSPECT MILD ANXIETY COMPONENT ADVISED NEED FOR PCP FOLLOWUP WE DISCUSSED RETURN PRECAUTIONS   Final Clinical Impressions(s) / ED Diagnoses   Final diagnoses:  SOB (shortness of breath)    New Prescriptions New Prescriptions   No medications on file     Zadie Rhine, MD 10/08/15 2009

## 2015-10-08 NOTE — ED Notes (Signed)
Ambulated patient around nurses station no issues while ambulating. Sats stayed in normal range 96%. Made Dr. Bebe ShaggyWickline aware of results.

## 2015-10-08 NOTE — Discharge Instructions (Signed)
PLEASE SEE YOUR PRIMARY DOCTOR IN ONE WEEK IF YOU STILL HAVE EPISODES OF SHORTNESS OF BREATH

## 2015-10-08 NOTE — ED Notes (Signed)
RT in room.

## 2016-02-26 ENCOUNTER — Encounter (HOSPITAL_COMMUNITY): Payer: Self-pay | Admitting: *Deleted

## 2016-02-26 ENCOUNTER — Emergency Department (HOSPITAL_COMMUNITY): Payer: Self-pay

## 2016-02-26 ENCOUNTER — Emergency Department (HOSPITAL_COMMUNITY)
Admission: EM | Admit: 2016-02-26 | Discharge: 2016-02-26 | Disposition: A | Discharge status: Home / Self Care | Payer: Self-pay | Attending: Emergency Medicine | Admitting: Emergency Medicine

## 2016-02-26 DIAGNOSIS — Z7982 Long term (current) use of aspirin: Secondary | ICD-10-CM | POA: Insufficient documentation

## 2016-02-26 DIAGNOSIS — J45909 Unspecified asthma, uncomplicated: Secondary | ICD-10-CM | POA: Insufficient documentation

## 2016-02-26 DIAGNOSIS — R0789 Other chest pain: Secondary | ICD-10-CM | POA: Insufficient documentation

## 2016-02-26 DIAGNOSIS — R0602 Shortness of breath: Secondary | ICD-10-CM | POA: Insufficient documentation

## 2016-02-26 DIAGNOSIS — Z87891 Personal history of nicotine dependence: Secondary | ICD-10-CM | POA: Insufficient documentation

## 2016-02-26 DIAGNOSIS — R079 Chest pain, unspecified: Secondary | ICD-10-CM

## 2016-02-26 DIAGNOSIS — Z7984 Long term (current) use of oral hypoglycemic drugs: Secondary | ICD-10-CM | POA: Insufficient documentation

## 2016-02-26 DIAGNOSIS — E119 Type 2 diabetes mellitus without complications: Secondary | ICD-10-CM | POA: Insufficient documentation

## 2016-02-26 LAB — D-DIMER, QUANTITATIVE: D-Dimer, Quant: <0.27 ug/mL-FEU (ref 0.00–0.50)

## 2016-02-26 LAB — CBC WITH DIFFERENTIAL/PLATELET
Basophils Absolute: 0 10*3/uL (ref 0.0–0.1)
Basophils Relative: 1 %
Eosinophils Absolute: 0.2 10*3/uL (ref 0.0–0.7)
Eosinophils Relative: 2 %
HCT: 39.2 % (ref 36.0–46.0)
Hemoglobin: 13.1 g/dL (ref 12.0–15.0)
Lymphocytes Relative: 21 %
Lymphs Abs: 1.6 10*3/uL (ref 0.7–4.0)
MCH: 30.8 pg (ref 26.0–34.0)
MCHC: 33.4 g/dL (ref 30.0–36.0)
MCV: 92 fL (ref 78.0–100.0)
Monocytes Absolute: 0.5 10*3/uL (ref 0.1–1.0)
Monocytes Relative: 7 %
Neutro Abs: 5.2 10*3/uL (ref 1.7–7.7)
Neutrophils Relative %: 69 %
Platelets: 185 10*3/uL (ref 150–400)
RBC: 4.26 MIL/uL (ref 3.87–5.11)
RDW: 14 % (ref 11.5–15.5)
WBC: 7.5 10*3/uL (ref 4.0–10.5)

## 2016-02-26 LAB — BASIC METABOLIC PANEL
Anion gap: 8 (ref 5–15)
BUN: 13 mg/dL (ref 6–20)
CO2: 24 mmol/L (ref 22–32)
Calcium: 9.7 mg/dL (ref 8.9–10.3)
Chloride: 104 mmol/L (ref 101–111)
Creatinine, Ser: 0.88 mg/dL (ref 0.44–1.00)
GFR calc Af Amer: >60 mL/min (ref >60)
GFR calc non Af Amer: >60 mL/min (ref >60)
Glucose, Bld: 100 mg/dL — ABNORMAL HIGH (ref 65–99)
Potassium: 3.8 mmol/L (ref 3.5–5.1)
Sodium: 136 mmol/L (ref 135–145)

## 2016-02-26 LAB — I-STAT TROPONIN, ED
Troponin i, poc: 0 ng/mL (ref 0.00–0.08)
Troponin i, poc: 0 ng/mL (ref 0.00–0.08)

## 2016-02-26 MED ORDER — IBUPROFEN 600 MG PO TABS: 600.0000 mg | ORAL_TABLET | Freq: Three times a day (TID) | ORAL | 0 refills | Status: DC | PRN

## 2016-02-26 NOTE — ED Provider Notes (Signed)
[facility] DEPT Provider Note   CSN: [id number] Arrival date & time: 02/26/16  0905     History   Chief Complaint Chief Complaint  Patient presents with  . Chest Pain    HPI [name] is a 56 y.o. female.  HPI   Patient is a 56 year old female with history of diabetes, asthma and hep C who presents the ED with complaint of chest pain, onset 6 AM. Patient reports she woke up this morning due to having constant sharp pain to the left side of her chest. Patient reports her chest pain is worse when taking a deep breath. Denies radiation. Endorses associated shortness of breath. She reports having mild lightheadedness that occurred when she first sat up in bed but notes it only lasted a few seconds and has since resolved. Patient reports taking 4 aspirin at home and endorses mild improvement of pain. She reports having similar episodes of pain in the past which have resolved when taking aspirin but notes today's episode was worse in severity. Denies fever, chills, headache, cough, wheezing, abdominal pain, nausea, vomiting, diaphoresis, lightheadedness, dizziness, weakness. Denies personal or family history of cardiac disease. Patient reports she quit smoking 3 months ago but states she was smoking approximately one pack per day. Denies any recent surgeries or hospitalizations. Denies leg swelling, use of exogenous estrogen, history of DVT/PE, any recent long car rides/airplane travel.  Past Medical History:  Diagnosis Date  . Asthma   . Bipolar 1 disorder (HCC)   . DKA (diabetic ketoacidoses) (HCC) 01/22/2012  . DM2 (diabetes mellitus, type 2) (HCC) 01/24/2012  . Fatty liver 01/24/2012  . Hepatitis C     Patient Active Problem List   Diagnosis Date Noted  . Fatty liver 01/24/2012  . DM2 (diabetes mellitus, type 2) (HCC) 01/24/2012  . Hepatitis C 01/23/2012  . Elevated liver enzymes 01/23/2012  . Hypoxia 01/23/2012  . DKA (diabetic ketoacidoses) (HCC) 01/22/2012  .  Diabetes mellitus (HCC) 01/22/2012  . Hyponatremia 01/22/2012  . Bipolar disorder, current episode manic severe with psychotic features (HCC) 11/24/2011    Class: Chronic    History reviewed. No pertinent surgical history.  OB History    No data available       Home Medications    Prior to Admission medications   Medication Sig Start Date End Date Taking? Authorizing Provider  acetaminophen (TYLENOL) 500 MG tablet Take 500 mg by mouth every 6 (six) hours as needed. Pain   Yes Historical Provider, MD  aspirin 325 MG tablet Take 325 mg by mouth daily.   Yes Historical Provider, MD  benztropine (COGENTIN) 0.5 MG tablet Take 0.5 mg by mouth 2 (two) times daily.   Yes Historical Provider, MD  busPIRone (BUSPAR) 10 MG tablet Take 10 mg by mouth 3 (three) times daily. 02/14/16  Yes Historical Provider, MD  diphenhydrAMINE (BENADRYL) 50 MG capsule Take 50 mg by mouth daily as needed for itching.   Yes Historical Provider, MD  levothyroxine (SYNTHROID, LEVOTHROID) 125 MCG tablet Take 1 tablet by mouth daily.   Yes Historical Provider, MD  lithium carbonate (LITHOBID) 300 MG CR tablet Take 300 mg by mouth 2 (two) times daily. 12/02/11  Yes Lloyd HugerNeil T Mashburn, PA-C  metFORMIN (GLUCOPHAGE) 500 MG tablet Take 1 tablet (500 mg total) by mouth 2 (two) times daily with a meal. For treatment of diabetes. Patient taking differently: Take 500 mg by mouth daily with breakfast. For treatment of diabetes. 01/24/12  Yes Elliot Cousinenise Fisher, MD  polyethylene  glycol (MIRALAX / GLYCOLAX) packet Take 17 g by mouth daily as needed (For constipation). 01/24/12  Yes Elliot Cousin, MD  QUEtiapine (SEROQUEL) 200 MG tablet Take 400 mg by mouth at bedtime. 01/23/16  Yes Historical Provider, MD  ibuprofen (ADVIL,MOTRIN) 600 MG tablet Take 1 tablet (600 mg total) by mouth every 8 (eight) hours as needed. 02/26/16   Azalia Bilis, MD    Family History No family history on file.  Social History Social History  Substance Use  Topics  . Smoking status: Former Smoker    Packs/day: 1.00    Types: Cigarettes    Quit date: 10/01/2015  . Smokeless tobacco: Never Used  . Alcohol use No     Allergies   Patient has no known allergies.   Review of Systems Review of Systems  Respiratory: Positive for shortness of breath.   Cardiovascular: Positive for chest pain.  All other systems reviewed and are negative.    Physical Exam Updated Vital Signs BP 111/65   Pulse 76   Temp 97.9 F (36.6 C) (Oral)   Resp 19   Ht 5\' 6"  (1.676 m)   Wt 54.9 kg   SpO2 100%   BMI 19.53 kg/m   Physical Exam  Constitutional: She is oriented to person, place, and time. She appears well-developed and well-nourished. No distress.  HENT:  Head: Normocephalic and atraumatic.  Mouth/Throat: Uvula is midline, oropharynx is clear and moist and mucous membranes are normal. No oropharyngeal exudate, posterior oropharyngeal edema, posterior oropharyngeal erythema or tonsillar abscesses. No tonsillar exudate.  Eyes: Conjunctivae and EOM are normal. Right eye exhibits no discharge. Left eye exhibits no discharge. No scleral icterus.  Neck: Normal range of motion. Neck supple.  Cardiovascular: Normal rate, regular rhythm, normal heart sounds and intact distal pulses.   Pulmonary/Chest: Effort normal and breath sounds normal. No respiratory distress. She has no wheezes. She has no rales. She exhibits tenderness (mild TTP over left anterior chest wall). She exhibits no laceration, no crepitus, no edema, no deformity, no swelling and no retraction.  Abdominal: Soft. Bowel sounds are normal. She exhibits no distension and no mass. There is no tenderness. There is no rebound and no guarding. No hernia.  Musculoskeletal: Normal range of motion. She exhibits no edema.  Lymphadenopathy:    She has no cervical adenopathy.  Neurological: She is alert and oriented to person, place, and time.  Skin: Skin is warm and dry. She is not diaphoretic.    Nursing note and vitals reviewed.    ED Treatments / Results  Labs (all labs ordered are listed, but only abnormal results are displayed) Labs Reviewed  BASIC METABOLIC PANEL - Abnormal; Notable for the following:       Result Value   Glucose, Bld 100 (*)    All other components within normal limits  CBC WITH DIFFERENTIAL/PLATELET  D-DIMER, QUANTITATIVE (NOT AT [facility])  Rosezena Sensor, ED  Rosezena Sensor, ED    EKG  EKG Interpretation  Date/Time:  Wednesday February 26 2016 09:26:49 EST Ventricular Rate:  62 PR Interval:    QRS Duration: 85 QT Interval:  421 QTC Calculation: 428 R Axis:   39 Text Interpretation:  Sinus rhythm No significant change was found Confirmed by CAMPOS  MD, Caryn Bee ([id number]) on 02/26/2016 10:27:51 AM       Radiology Dg Chest 2 View  Result Date: 02/26/2016 CLINICAL DATA:  Chest pain since this morning. EXAM: CHEST  2 VIEW COMPARISON:  10/08/2015 FINDINGS: The cardiac silhouette, mediastinal  and hilar contours are normal. The lungs demonstrate hyperinflation and mild apical scarring changes. Could not exclude emphysema. No infiltrates or effusions. The bony thorax is intact. IMPRESSION: Hyperinflation and possible emphysematous changes. No acute pulmonary findings. Electronically Signed   By: Rudie Meyer M.D.   On: 02/26/2016 09:57    Procedures Procedures (including critical care time)  Medications Ordered in ED Medications - No data to display   Initial Impression / Assessment and Plan / ED Course  I have reviewed the triage vital signs and the nursing notes.  Pertinent labs & imaging results that were available during my care of the patient were reviewed by me and considered in my medical decision making (see chart for details).     Patient presents with left-sided pleuritic chest pain that started this morning when she woke up. Endorses associated shortness of breath. Denies personal or family history of cardiac disease. Patient reports  relief after taking 4 aspirin prior to arrival. VSS. Exam revealed mild tenderness over left anterior chest wall. Lungs clear to auscultation bilaterally. Remaining exam unremarkable. EKG showed sinus rhythm with no acute ischemic changes. Troponin negative. Chest x-ray negative. D-dimer negative. Labs unremarkable. HEART score 2. On reevaluation patient denies having any pain or complaints at this time. Delta troponin negative. I have a low suspicion for ACS, PE, dissection, or other acute cardiac event at this time. Suspect patient's chest pain may likely be due to musculoskeletal chest wall pain. She endorses starting a new exercise regimen over the past 2 weeks. Plan to discharge patient home with symptomatic treatment and PCP follow-up. Discussed return precautions.    Final Clinical Impressions(s) / ED Diagnoses   Final diagnoses:  Chest pain, unspecified type    New Prescriptions Discharge Medication List as of 02/26/2016  1:02 PM    START taking these medications   Details  ibuprofen (ADVIL,MOTRIN) 600 MG tablet Take 1 tablet (600 mg total) by mouth every 8 (eight) hours as needed., Starting Wed 02/26/2016, Print         Satira Sark McClure, [place] 02/26/16 1429    Azalia Bilis, MD 02/26/16 1511

## 2016-02-26 NOTE — ED Notes (Signed)
ED Provider at bedside. 

## 2016-02-26 NOTE — ED Triage Notes (Signed)
Pt c/o mid chest pain that started this morning described as stabbing, along with SOB, dizziness and lightheadedness. Pt reports she took 4 regular ASA with some relief of pain. Pt reports the pain is worse now when she takes a breath.

## 2016-06-09 ENCOUNTER — Encounter (HOSPITAL_COMMUNITY): Payer: Self-pay | Admitting: *Deleted

## 2016-06-09 ENCOUNTER — Emergency Department (HOSPITAL_COMMUNITY)
Admission: EM | Admit: 2016-06-09 | Discharge: 2016-06-09 | Disposition: A | Discharge status: Home / Self Care | Payer: Self-pay | Attending: Emergency Medicine | Admitting: Emergency Medicine

## 2016-06-09 DIAGNOSIS — Z7984 Long term (current) use of oral hypoglycemic drugs: Secondary | ICD-10-CM | POA: Insufficient documentation

## 2016-06-09 DIAGNOSIS — Z7982 Long term (current) use of aspirin: Secondary | ICD-10-CM | POA: Insufficient documentation

## 2016-06-09 DIAGNOSIS — Z87891 Personal history of nicotine dependence: Secondary | ICD-10-CM | POA: Insufficient documentation

## 2016-06-09 DIAGNOSIS — J45909 Unspecified asthma, uncomplicated: Secondary | ICD-10-CM | POA: Insufficient documentation

## 2016-06-09 DIAGNOSIS — N3000 Acute cystitis without hematuria: Secondary | ICD-10-CM | POA: Insufficient documentation

## 2016-06-09 DIAGNOSIS — Z79899 Other long term (current) drug therapy: Secondary | ICD-10-CM | POA: Insufficient documentation

## 2016-06-09 DIAGNOSIS — E119 Type 2 diabetes mellitus without complications: Secondary | ICD-10-CM | POA: Insufficient documentation

## 2016-06-09 LAB — URINALYSIS, ROUTINE W REFLEX MICROSCOPIC
Bilirubin Urine: NEGATIVE
Glucose, UA: NEGATIVE mg/dL
Hgb urine dipstick: NEGATIVE
Ketones, ur: NEGATIVE mg/dL
Nitrite: POSITIVE — AB
Protein, ur: NEGATIVE mg/dL
Specific Gravity, Urine: 1.008 (ref 1.005–1.030)
pH: 7 (ref 5.0–8.0)

## 2016-06-09 LAB — COMPREHENSIVE METABOLIC PANEL
ALT: 23 U/L (ref 14–54)
AST: 23 U/L (ref 15–41)
Albumin: 5 g/dL (ref 3.5–5.0)
Alkaline Phosphatase: 61 U/L (ref 38–126)
Anion gap: 8 (ref 5–15)
BUN: 15 mg/dL (ref 6–20)
CO2: 24 mmol/L (ref 22–32)
Calcium: 10 mg/dL (ref 8.9–10.3)
Chloride: 107 mmol/L (ref 101–111)
Creatinine, Ser: 0.91 mg/dL (ref 0.44–1.00)
GFR calc Af Amer: >60 mL/min (ref >60)
GFR calc non Af Amer: >60 mL/min (ref >60)
Glucose, Bld: 106 mg/dL — ABNORMAL HIGH (ref 65–99)
Potassium: 4.4 mmol/L (ref 3.5–5.1)
Sodium: 139 mmol/L (ref 135–145)
Total Bilirubin: 0.4 mg/dL (ref 0.3–1.2)
Total Protein: 8.2 g/dL — ABNORMAL HIGH (ref 6.5–8.1)

## 2016-06-09 LAB — CBC
HCT: 43.1 % (ref 36.0–46.0)
Hemoglobin: 13.9 g/dL (ref 12.0–15.0)
MCH: 31 pg (ref 26.0–34.0)
MCHC: 32.3 g/dL (ref 30.0–36.0)
MCV: 96 fL (ref 78.0–100.0)
Platelets: 189 10*3/uL (ref 150–400)
RBC: 4.49 MIL/uL (ref 3.87–5.11)
RDW: 13.1 % (ref 11.5–15.5)
WBC: 9.3 10*3/uL (ref 4.0–10.5)

## 2016-06-09 LAB — LIPASE, BLOOD: Lipase: 38 U/L (ref 11–51)

## 2016-06-09 MED ORDER — SULFAMETHOXAZOLE-TRIMETHOPRIM 800-160 MG PO TABS: 1.0000 | ORAL_TABLET | Freq: Two times a day (BID) | ORAL | 0 refills | Status: AC

## 2016-06-09 MED ORDER — SULFAMETHOXAZOLE-TRIMETHOPRIM 800-160 MG PO TABS
1.0000 | ORAL_TABLET | Freq: Once | ORAL | Status: AC
  Administered 2016-06-09: 1 via ORAL
  Filled 2016-06-09: qty 1

## 2016-06-09 NOTE — ED Triage Notes (Signed)
Pt c/o dull constant right sided abdominal pain x 3 days. Pt has cirrhosis. Denies n/v/d, fever.

## 2016-06-09 NOTE — Discharge Instructions (Signed)
Take your next dose of the antibiotic tomorrow morning.  Make sure you are drinking plenty of fluids.

## 2016-06-09 NOTE — ED Provider Notes (Signed)
[facility] Provider Note   CSN: [id number] Arrival date & time: 06/09/16  1526     History   Chief Complaint Chief Complaint  Patient presents with  . Abdominal Pain    HPI [name] is a 56 y.o. female with a past medical history Significant for diabetes in hepatitis C presenting with a 3 day history of mild suprapubic and right lower quadrant abdominal "soreness".  In addition to increased urinary frequency.  She denies fevers or chills, nausea, vomiting, diarrhea or changes in appetite.  She also denies hematuria, has noticed some increased pressure with urinary frequency.  Denies flank or back pain.  She has had no medications for treatment of her symptoms.  She checks her blood sugars daily, and is very well-controlled, stating she was within the normal range this morning.  The history is provided by the patient.    Past Medical History:  Diagnosis Date  . Asthma   . Bipolar 1 disorder (HCC)   . DKA (diabetic ketoacidoses) (HCC) 01/22/2012  . DM2 (diabetes mellitus, type 2) (HCC) 01/24/2012  . Fatty liver 01/24/2012  . Hepatitis C     Patient Active Problem List   Diagnosis Date Noted  . Fatty liver 01/24/2012  . DM2 (diabetes mellitus, type 2) (HCC) 01/24/2012  . Hepatitis C 01/23/2012  . Elevated liver enzymes 01/23/2012  . Hypoxia 01/23/2012  . DKA (diabetic ketoacidoses) (HCC) 01/22/2012  . Diabetes mellitus (HCC) 01/22/2012  . Hyponatremia 01/22/2012  . Bipolar disorder, current episode manic severe with psychotic features (HCC) 11/24/2011    Class: Chronic    History reviewed. No pertinent surgical history.  OB History    No data available       Home Medications    Prior to Admission medications   Medication Sig Start Date End Date Taking? Authorizing Provider  acetaminophen (TYLENOL) 500 MG tablet Take 500 mg by mouth every 6 (six) hours as needed. Pain    [provider]  aspirin 325 MG tablet Take 325 mg by mouth daily.     [provider]  benztropine (COGENTIN) 0.5 MG tablet Take 0.5 mg by mouth 2 (two) times daily.    [provider]  busPIRone (BUSPAR) 10 MG tablet Take 10 mg by mouth 3 (three) times daily. 02/14/16   [provider]  diphenhydrAMINE (BENADRYL) 50 MG capsule Take 50 mg by mouth daily as needed for itching.    [provider]  ibuprofen (ADVIL,MOTRIN) 600 MG tablet Take 1 tablet (600 mg total) by mouth every 8 (eight) hours as needed. 02/26/16   Azalia Bilis, MD  levothyroxine (SYNTHROID, LEVOTHROID) 125 MCG tablet Take 1 tablet by mouth daily.    [provider]  lithium carbonate (LITHOBID) 300 MG CR tablet Take 300 mg by mouth 2 (two) times daily. 12/02/11   Tamala Julian, PA-C  metFORMIN (GLUCOPHAGE) 500 MG tablet Take 1 tablet (500 mg total) by mouth 2 (two) times daily with a meal. For treatment of diabetes. Patient taking differently: Take 500 mg by mouth daily with breakfast. For treatment of diabetes. 01/24/12   Elliot Cousin, MD  polyethylene glycol [facility]) packet Take 17 g by mouth daily as needed (For constipation). 01/24/12   Elliot Cousin, MD  QUEtiapine (SEROQUEL) 200 MG tablet Take 400 mg by mouth at bedtime. 01/23/16   [provider]  sulfamethoxazole-trimethoprim (BACTRIM DS,SEPTRA DS) 800-160 MG tablet Take 1 tablet by mouth 2 (two) times daily. 06/09/16 06/12/16  Hazelene Doten,  Fidela JuneauJulie, PA-C    Family History No family history on file.  Social History Social History  Substance Use Topics  . Smoking status: Former Smoker    Packs/day: 1.00    Types: Cigarettes    Quit date: 10/01/2015  . Smokeless tobacco: Never Used  . Alcohol use No     Comment: hx of alcoholism - quit 2012     Allergies   Patient has no known allergies.   Review of Systems Review of Systems  Constitutional: Negative for appetite change and fever.  HENT: Negative for congestion and sore throat.   Eyes: Negative.   Respiratory:  Negative for chest tightness and shortness of breath.   Cardiovascular: Negative for chest pain.  Gastrointestinal: Positive for abdominal pain. Negative for diarrhea, nausea and vomiting.  Genitourinary: Positive for frequency and urgency. Negative for dysuria and pelvic pain.  Musculoskeletal: Negative for arthralgias, joint swelling and neck pain.  Skin: Negative.  Negative for rash and wound.  Neurological: Negative for dizziness, weakness, light-headedness, numbness and headaches.  Psychiatric/Behavioral: Negative.      Physical Exam Updated Vital Signs BP 107/63   Pulse 65   Temp 98.2 F (36.8 C) (Oral)   Resp 18   Ht 5\' 6"  (1.676 m)   Wt 56.7 kg   SpO2 100%   BMI 20.18 kg/m   Physical Exam  Constitutional: She appears well-developed and well-nourished.  HENT:  Head: Normocephalic and atraumatic.  Eyes: Conjunctivae are normal.  Neck: Normal range of motion.  Cardiovascular: Normal rate, regular rhythm, normal heart sounds and intact distal pulses.   Pulmonary/Chest: Effort normal and breath sounds normal. She has no wheezes.  Abdominal: Soft. Bowel sounds are normal. There is tenderness in the suprapubic area. There is no rebound, no guarding, no CVA tenderness and no tenderness at McBurney's point.  Musculoskeletal: Normal range of motion.  Neurological: She is alert.  Skin: Skin is warm and dry.  Psychiatric: She has a normal mood and affect.  Nursing note and vitals reviewed.    ED Treatments / Results  Labs (all labs ordered are listed, but only abnormal results are displayed) Labs Reviewed  COMPREHENSIVE METABOLIC PANEL - Abnormal; Notable for the following:       Result Value   Glucose, Bld 106 (*)    Total Protein 8.2 (*)    All other components within normal limits  URINALYSIS, ROUTINE W REFLEX MICROSCOPIC - Abnormal; Notable for the following:    APPearance HAZY (*)    Nitrite POSITIVE (*)    Leukocytes, UA TRACE (*)    Bacteria, UA RARE (*)     Squamous Epithelial / LPF 0-5 (*)    All other components within normal limits  URINE CULTURE  LIPASE, BLOOD  CBC    EKG  EKG Interpretation None       Radiology No results found.  Procedures Procedures (including critical care time)  Medications Ordered in ED Medications  sulfamethoxazole-trimethoprim (BACTRIM DS,SEPTRA DS) 800-160 MG per tablet 1 tablet (not administered)     Initial Impression / Assessment and Plan / ED Course  I have reviewed the triage vital signs and the nursing notes.  Pertinent labs & imaging results that were available during my care of the patient were reviewed by me and considered in my medical decision making (see chart for details).     UA suggesting probable uti, cx ordered.  Given sx, will cover with bactrim , first dose given here.  Increased fluid intake, strict  return precautions given.  Pt with stable labs, abdominal exam without acute findings, no guarding to suggest acute abdomen.  Final Clinical Impressions(s) / ED Diagnoses   Final diagnoses:  Acute cystitis without hematuria    New Prescriptions New Prescriptions   SULFAMETHOXAZOLE-TRIMETHOPRIM (BACTRIM DS,SEPTRA DS) 800-160 MG TABLET    Take 1 tablet by mouth 2 (two) times daily.     Burgess Amor, PA-C 06/10/16 0319    Marily Memos, MD 06/11/16 [phone]

## 2016-06-12 LAB — URINE CULTURE: Culture: >=100000 — AB

## 2016-06-13 ENCOUNTER — Telehealth: Payer: Self-pay

## 2016-06-13 NOTE — Telephone Encounter (Signed)
Post ED Visit - Positive Culture Follow-up  Culture report reviewed by antimicrobial stewardship pharmacist:  []  [name], Pharm.D. []  [name], Pharm.D., BCPS AQ-ID []  [name], Pharm.D., BCPS []  [name], Pharm.D., BCPS []  [name], [address]Pharm[address]D., BCPS, AAHIVP []  [name], Pharm.D., BCPS, AAHIVP []  [name], PharmD, BCPS []  [name], PharmD, BCPS []  [name], PharmD, BCPS [name] Pharm D Positive urine culture Treated with Sulfamethoxazole-Trimethoprim, organism sensitive to the same and no further patient follow-up is required at this time.  Jerry CarasCullom, [name] 06/13/2016, 11:26 AM

## 2016-07-27 ENCOUNTER — Encounter (HOSPITAL_COMMUNITY): Payer: Self-pay | Admitting: Emergency Medicine

## 2016-07-27 ENCOUNTER — Emergency Department (HOSPITAL_COMMUNITY): Payer: Self-pay

## 2016-07-27 ENCOUNTER — Emergency Department (HOSPITAL_COMMUNITY)
Admission: EM | Admit: 2016-07-27 | Discharge: 2016-07-27 | Disposition: A | Discharge status: Home / Self Care | Payer: Self-pay | Attending: Emergency Medicine | Admitting: Emergency Medicine

## 2016-07-27 DIAGNOSIS — Z79899 Other long term (current) drug therapy: Secondary | ICD-10-CM | POA: Insufficient documentation

## 2016-07-27 DIAGNOSIS — Z87891 Personal history of nicotine dependence: Secondary | ICD-10-CM | POA: Insufficient documentation

## 2016-07-27 DIAGNOSIS — R05 Cough: Secondary | ICD-10-CM | POA: Insufficient documentation

## 2016-07-27 DIAGNOSIS — Z7984 Long term (current) use of oral hypoglycemic drugs: Secondary | ICD-10-CM | POA: Insufficient documentation

## 2016-07-27 DIAGNOSIS — R059 Cough, unspecified: Secondary | ICD-10-CM

## 2016-07-27 DIAGNOSIS — E1110 Type 2 diabetes mellitus with ketoacidosis without coma: Secondary | ICD-10-CM | POA: Insufficient documentation

## 2016-07-27 MED ORDER — PREDNISONE 20 MG PO TABS: 40.0000 mg | ORAL_TABLET | Freq: Every day | ORAL | 0 refills | Status: AC

## 2016-07-27 MED ORDER — BENZONATATE 100 MG PO CAPS: 100.0000 mg | ORAL_CAPSULE | Freq: Three times a day (TID) | ORAL | 0 refills | Status: DC | PRN

## 2016-07-27 NOTE — ED Triage Notes (Signed)
Patient complaining of cough x 1 week.  

## 2016-07-27 NOTE — Discharge Instructions (Signed)
Continue the albuterol, 1-2 puffs every 4-6 hrs as needed.  Drink plenty of fluids.  Follow-up with your doctor for recheck

## 2016-07-29 NOTE — ED Provider Notes (Signed)
[facility] DEPT Provider Note   CSN: [id number] Arrival date & time: 07/27/16  1207     History   Chief Complaint Chief Complaint  Patient presents with  . Cough    HPI [name] is a 56 y.o. female.  HPI   [name] is a 56 y.o. female who presents to the Emergency Department complaining of persistent cough for one week.  She states cough is non-productive.  Began with nasal congestion as well, but now cleared.  She denies shortness of breath, chest pain, fever, chills and sore throat.    Past Medical History:  Diagnosis Date  . Asthma   . Bipolar 1 disorder (HCC)   . DKA (diabetic ketoacidoses) (HCC) 01/22/2012  . DM2 (diabetes mellitus, type 2) (HCC) 01/24/2012  . Fatty liver 01/24/2012  . Hepatitis C     Patient Active Problem List   Diagnosis Date Noted  . Fatty liver 01/24/2012  . DM2 (diabetes mellitus, type 2) (HCC) 01/24/2012  . Hepatitis C 01/23/2012  . Elevated liver enzymes 01/23/2012  . Hypoxia 01/23/2012  . DKA (diabetic ketoacidoses) (HCC) 01/22/2012  . Diabetes mellitus (HCC) 01/22/2012  . Hyponatremia 01/22/2012  . Bipolar disorder, current episode manic severe with psychotic features (HCC) 11/24/2011    Class: Chronic    History reviewed. No pertinent surgical history.  OB History    No data available       Home Medications    Prior to Admission medications   Medication Sig Start Date End Date Taking? Authorizing Provider  acetaminophen (TYLENOL) 500 MG tablet Take 500 mg by mouth every 6 (six) hours as needed. Pain   Yes [provider]  busPIRone (BUSPAR) 10 MG tablet Take 10 mg by mouth 3 (three) times daily. 02/14/16  Yes [provider]  diphenhydrAMINE (BENADRYL) 50 MG capsule Take 50 mg by mouth daily as needed for itching.   Yes [provider]  levothyroxine (SYNTHROID, LEVOTHROID) 125 MCG tablet Take 1 tablet by mouth daily.   Yes [provider]  lithium carbonate (LITHOBID)  300 MG CR tablet Take 300 mg by mouth 2 (two) times daily. 12/02/11  Yes Mashburn, Rona RavensNeil T, PA-C  QUEtiapine (SEROQUEL) 200 MG tablet Take 400 mg by mouth at bedtime. 01/23/16  Yes [provider]  sitaGLIPtin-metformin (JANUMET) 50-500 MG tablet Take 1 tablet by mouth daily.   Yes [provider]  benzonatate (TESSALON) 100 MG capsule Take 1 capsule (100 mg total) by mouth 3 (three) times daily as needed for cough. Swallow whole, do not chew 07/27/16   Angely Dietz, PA-C  predniSONE (DELTASONE) 20 MG tablet Take 2 tablets (40 mg total) by mouth daily. For 5 days 07/27/16   Pauline Ausriplett, Jema Deegan, PA-C    Family History History reviewed. No pertinent family history.  Social History Social History  Substance Use Topics  . Smoking status: Former Smoker    Packs/day: 1.00    Types: Cigarettes    Quit date: 10/01/2015  . Smokeless tobacco: Never Used  . Alcohol use No     Comment: hx of alcoholism - quit 2012     Allergies   Patient has no known allergies.   Review of Systems Review of Systems  Constitutional: Negative for appetite change, chills and fever.  HENT: Positive for congestion. Negative for sore throat and trouble swallowing.   Respiratory: Positive for cough. Negative for chest tightness, shortness of breath and wheezing.   Cardiovascular: Negative for chest pain.  Gastrointestinal: Negative  for abdominal pain, nausea and vomiting.  Genitourinary: Negative for dysuria.  Musculoskeletal: Negative for arthralgias.  Skin: Negative for rash.  Neurological: Negative for dizziness, weakness and numbness.  Hematological: Negative for adenopathy.  All other systems reviewed and are negative.    Physical Exam Updated Vital Signs BP 102/66 (BP Location: Left Arm)   Pulse 61   Temp 98.1 F (36.7 C) (Oral)   Resp 16   Ht 5\' 5"  (1.651 m)   Wt 59 kg (130 lb)   SpO2 100%   BMI 21.63 kg/m   Physical Exam  Constitutional: She is oriented to person, place, and  time. She appears well-developed and well-nourished. No distress.  HENT:  Head: Normocephalic and atraumatic.  Right Ear: Tympanic membrane and ear canal normal.  Left Ear: Tympanic membrane and ear canal normal.  Mouth/Throat: Uvula is midline, oropharynx is clear and moist and mucous membranes are normal. No oropharyngeal exudate.  Eyes: EOM are normal. Pupils are equal, round, and reactive to light.  Neck: Normal range of motion, full passive range of motion without pain and phonation normal. Neck supple.  Cardiovascular: Normal rate, regular rhythm, normal heart sounds and intact distal pulses.   No murmur heard. Pulmonary/Chest: Effort normal and breath sounds normal. No stridor. No respiratory distress. She has no rales. She exhibits no tenderness.  Abdominal: Soft. She exhibits no distension. There is no tenderness.  Musculoskeletal: She exhibits no edema.  Lymphadenopathy:    She has no cervical adenopathy.  Neurological: She is alert and oriented to person, place, and time. She exhibits normal muscle tone. Coordination normal.  Skin: Skin is warm and dry. Capillary refill takes less than 2 seconds. No rash noted.  Psychiatric: She has a normal mood and affect.  Nursing note and vitals reviewed.    ED Treatments / Results  Labs (all labs ordered are listed, but only abnormal results are displayed) Labs Reviewed - No data to display  EKG  EKG Interpretation None       Radiology Dg Chest 2 View  Result Date: 07/27/2016 CLINICAL DATA:  Cough for 1 week, asthma EXAM: CHEST  2 VIEW COMPARISON:  02/26/2016 FINDINGS: Hyperinflation of the lungs. Heart and mediastinal contours are within normal limits. No focal opacities or effusions. No acute bony abnormality. IMPRESSION: No active cardiopulmonary disease. Electronically Signed   By: Charlett Nose M.D.   On: 07/27/2016 13:09     Procedures Procedures (including critical care time)  Medications Ordered in ED Medications -  No data to display   Initial Impression / Assessment and Plan / ED Course  I have reviewed the triage vital signs and the nursing notes.  Pertinent labs & imaging results that were available during my care of the patient were reviewed by me and considered in my medical decision making (see chart for details).     Pt well appearing, lungs clear to ascultation bilaterally.  Vital stable.    Final Clinical Impressions(s) / ED Diagnoses   Final diagnoses:  Cough    New Prescriptions Discharge Medication List as of 07/27/2016  3:17 PM    START taking these medications   Details  benzonatate (TESSALON) 100 MG capsule Take 1 capsule (100 mg total) by mouth 3 (three) times daily as needed for cough. Swallow whole, do not chew, Starting Mon 07/27/2016, Print    predniSONE (DELTASONE) 20 MG tablet Take 2 tablets (40 mg total) by mouth daily. For 5 days, Starting Mon 07/27/2016, Print  Pauline Aus, PA-C 07/29/16 1817    Bethann Berkshire, MD 07/31/16 1020

## 2016-11-13 ENCOUNTER — Encounter (HOSPITAL_COMMUNITY): Payer: Self-pay | Admitting: Emergency Medicine

## 2016-11-13 ENCOUNTER — Emergency Department (HOSPITAL_COMMUNITY)
Admission: EM | Admit: 2016-11-13 | Discharge: 2016-11-13 | Disposition: A | Discharge status: Home / Self Care | Payer: Self-pay | Attending: Emergency Medicine | Admitting: Emergency Medicine

## 2016-11-13 DIAGNOSIS — N3000 Acute cystitis without hematuria: Secondary | ICD-10-CM | POA: Insufficient documentation

## 2016-11-13 DIAGNOSIS — Z79899 Other long term (current) drug therapy: Secondary | ICD-10-CM | POA: Insufficient documentation

## 2016-11-13 DIAGNOSIS — J45909 Unspecified asthma, uncomplicated: Secondary | ICD-10-CM | POA: Insufficient documentation

## 2016-11-13 DIAGNOSIS — Z87891 Personal history of nicotine dependence: Secondary | ICD-10-CM | POA: Insufficient documentation

## 2016-11-13 DIAGNOSIS — F319 Bipolar disorder, unspecified: Secondary | ICD-10-CM | POA: Insufficient documentation

## 2016-11-13 DIAGNOSIS — E119 Type 2 diabetes mellitus without complications: Secondary | ICD-10-CM | POA: Insufficient documentation

## 2016-11-13 DIAGNOSIS — Z7984 Long term (current) use of oral hypoglycemic drugs: Secondary | ICD-10-CM | POA: Insufficient documentation

## 2016-11-13 LAB — URINALYSIS, ROUTINE W REFLEX MICROSCOPIC
Bacteria, UA: NONE SEEN
Bilirubin Urine: NEGATIVE
Glucose, UA: NEGATIVE mg/dL
Hgb urine dipstick: NEGATIVE
Ketones, ur: NEGATIVE mg/dL
Nitrite: POSITIVE — AB
Protein, ur: NEGATIVE mg/dL
Specific Gravity, Urine: 1.006 (ref 1.005–1.030)
pH: 5 (ref 5.0–8.0)

## 2016-11-13 MED ORDER — PHENAZOPYRIDINE HCL 100 MG PO TABS
200.0000 mg | ORAL_TABLET | Freq: Once | ORAL | Status: AC
  Administered 2016-11-13: 200 mg via ORAL
  Filled 2016-11-13: qty 2

## 2016-11-13 MED ORDER — PHENAZOPYRIDINE HCL 200 MG PO TABS: 200.0000 mg | ORAL_TABLET | Freq: Three times a day (TID) | ORAL | 0 refills | Status: AC

## 2016-11-13 MED ORDER — CEPHALEXIN 500 MG PO CAPS
500.0000 mg | ORAL_CAPSULE | Freq: Once | ORAL | Status: AC
  Administered 2016-11-13: 500 mg via ORAL
  Filled 2016-11-13: qty 1

## 2016-11-13 MED ORDER — CEPHALEXIN 500 MG PO CAPS: 500.0000 mg | ORAL_CAPSULE | Freq: Four times a day (QID) | ORAL | 0 refills | Status: DC

## 2016-11-13 NOTE — Discharge Instructions (Signed)
Drink plenty of water.  Take the antibiotic as directed until its finished.  Follow-up with your primary doctor or return here for any worsening symptoms.  You can also contact the urology group listed to arrange follow-up

## 2016-11-13 NOTE — ED Triage Notes (Signed)
Pt states 3 days ago began having R sided flank pain, stabbing in nature, noted to have increased frequency and urgency. Denies burning with urination, hematuria, N/V/D/, fever or chills. States pain has decreased some.

## 2016-11-15 NOTE — ED Provider Notes (Signed)
[facility] Provider Note   CSN: [id number] Arrival date & time: 11/13/16  1032     History   Chief Complaint Chief Complaint  Patient presents with  . Flank Pain    right side    HPI [name] is a 56 y.o. female.  HPI   [name] is a 56 y.o. female who presents to the Emergency Department complaining of right sided back and pubic pain, increased urinary frequency and urgency.  Symptoms present for 3 days, but seem to be improving.  Hx of UTI and states pain feels similar.  She denies abdominal pain, fever, nausea, vomiting, vaginal bleeding or discharge, hematuria, and burning with urination.    Past Medical History:  Diagnosis Date  . Asthma   . Bipolar 1 disorder (HCC)   . DKA (diabetic ketoacidoses) (HCC) 01/22/2012  . DM2 (diabetes mellitus, type 2) (HCC) 01/24/2012  . Fatty liver 01/24/2012  . Hepatitis C     Patient Active Problem List   Diagnosis Date Noted  . Fatty liver 01/24/2012  . DM2 (diabetes mellitus, type 2) (HCC) 01/24/2012  . Hepatitis C 01/23/2012  . Elevated liver enzymes 01/23/2012  . Hypoxia 01/23/2012  . DKA (diabetic ketoacidoses) (HCC) 01/22/2012  . Diabetes mellitus (HCC) 01/22/2012  . Hyponatremia 01/22/2012  . Bipolar disorder, current episode manic severe with psychotic features (HCC) 11/24/2011    Class: Chronic    History reviewed. No pertinent surgical history.  OB History    No data available       Home Medications    Prior to Admission medications   Medication Sig Start Date End Date Taking? Authorizing Provider  acetaminophen (TYLENOL) 500 MG tablet Take 500 mg by mouth every 6 (six) hours as needed. Pain    [provider]  benzonatate (TESSALON) 100 MG capsule Take 1 capsule (100 mg total) by mouth 3 (three) times daily as needed for cough. Swallow whole, do not chew 07/27/16   Tanyon Alipio, PA-C  busPIRone (BUSPAR) 10 MG tablet Take 10 mg by mouth 3 (three) times daily. 02/14/16    [provider]  cephALEXin (KEFLEX) 500 MG capsule Take 1 capsule (500 mg total) by mouth 4 (four) times daily. For 7 days 11/13/16   Cameron Katayama, PA-C  diphenhydrAMINE (BENADRYL) 50 MG capsule Take 50 mg by mouth daily as needed for itching.    [provider]  levothyroxine (SYNTHROID, LEVOTHROID) 125 MCG tablet Take 1 tablet by mouth daily.    [provider]  lithium carbonate (LITHOBID) 300 MG CR tablet Take 300 mg by mouth 2 (two) times daily. 12/02/11   Tamala Julian, PA-C  phenazopyridine (PYRIDIUM) 200 MG tablet Take 1 tablet (200 mg total) by mouth 3 (three) times daily. 11/13/16   Rosaleigh Brazzel, PA-C  predniSONE (DELTASONE) 20 MG tablet Take 2 tablets (40 mg total) by mouth daily. For 5 days 07/27/16   Misaki Sozio, PA-C  QUEtiapine (SEROQUEL) 200 MG tablet Take 400 mg by mouth at bedtime. 01/23/16   [provider]  sitaGLIPtin-metformin (JANUMET) 50-500 MG tablet Take 1 tablet by mouth daily.    [provider]    Family History History reviewed. No pertinent family history.  Social History Social History  Substance Use Topics  . Smoking status: Former Smoker    Packs/day: 1.00    Types: Cigarettes    Quit date: 10/01/2015  . Smokeless tobacco: Never Used  . Alcohol use No     Comment: hx  of alcoholism - quit 2012     Allergies   Patient has no known allergies.   Review of Systems Review of Systems  Constitutional: Negative for activity change, appetite change, chills and fever.  Respiratory: Negative for chest tightness and shortness of breath.   Gastrointestinal: Negative for abdominal pain, nausea and vomiting.  Genitourinary: Positive for dysuria, flank pain, frequency and urgency. Negative for decreased urine volume, difficulty urinating, hematuria, vaginal bleeding and vaginal discharge.  Musculoskeletal: Negative for back pain.  Skin: Negative for rash.  Neurological: Negative for dizziness, weakness  and numbness.  Hematological: Negative for adenopathy.  Psychiatric/Behavioral: Negative for confusion.  All other systems reviewed and are negative.    Physical Exam Updated Vital Signs BP (!) 114/56 (BP Location: Right Arm)   Pulse 88   Temp 98 F (36.7 C) (Oral)   Resp 18   Ht  (1.676 m)   Wt 66.2 kg (146 lb)   SpO2 99%   BMI 23.57 kg/m   Physical Exam  Constitutional: She is oriented to person, place, and time. She appears well-developed and well-nourished. No distress.  HENT:  Head: Normocephalic and atraumatic.  Mouth/Throat: Oropharynx is clear and moist.  Cardiovascular: Normal rate, regular rhythm and intact distal pulses.   No murmur heard. Pulmonary/Chest: Effort normal and breath sounds normal. No respiratory distress. She has no wheezes. She has no rales.  Abdominal: Soft. Normal appearance. She exhibits no distension and no mass. There is no hepatosplenomegaly. There is tenderness in the suprapubic area. There is CVA tenderness. There is no rigidity, no rebound, no guarding and no tenderness at McBurney's point.  Mild ttp of the suprapubic region and slight right CVA tenderness. Remaining abdomen is soft, non-tender without guarding or rebound tenderness.   Musculoskeletal: Normal range of motion. She exhibits no edema.  Neurological: She is alert and oriented to person, place, and time. Coordination normal.  Skin: Skin is warm and dry. No rash noted.  Psychiatric: She has a normal mood and affect.  Nursing note and vitals reviewed.    ED Treatments / Results  Labs (all labs ordered are listed, but only abnormal results are displayed) Labs Reviewed  URINE CULTURE - Abnormal; Notable for the following:       Result Value   Culture >=100,000 COLONIES/mL ESCHERICHIA COLI (*)    All other components within normal limits  URINALYSIS, ROUTINE W REFLEX MICROSCOPIC - Abnormal; Notable for the following:    APPearance HAZY (*)    Nitrite POSITIVE (*)     Leukocytes, UA MODERATE (*)    Squamous Epithelial / LPF 0-5 (*)    All other components within normal limits    EKG  EKG Interpretation None       Radiology No results found.  Procedures Procedures (including critical care time)  Medications Ordered in ED Medications  cephALEXin (KEFLEX) capsule 500 mg (500 mg Oral Given 11/13/16 1155)  phenazopyridine (PYRIDIUM) tablet 200 mg (200 mg Oral Given 11/13/16 1155)     Initial Impression / Assessment and Plan / ED Course  I have reviewed the triage vital signs and the nursing notes.  Pertinent labs & imaging results that were available during my care of the patient were reviewed by me and considered in my medical decision making (see chart for details).     Pt well appearing.  Non-toxic.  Mild CVA tenderness w/o vomiting or fever.  Possible early developing pyelonephritis.  Pt appears stable for d/c, return precautions discussed. Urine  culture pending.  No concerning sx for nephrolithiasis.    Final Clinical Impressions(s) / ED Diagnoses   Final diagnoses:  Acute cystitis without hematuria    New Prescriptions Discharge Medication List as of 11/13/2016 11:50 AM    START taking these medications   Details  cephALEXin (KEFLEX) 500 MG capsule Take 1 capsule (500 mg total) by mouth 4 (four) times daily. For 7 days, Starting Fri 11/13/2016, Print    phenazopyridine (PYRIDIUM) 200 MG tablet Take 1 tablet (200 mg total) by mouth 3 (three) times daily., Starting Fri 11/13/2016, Print         Greer Wainright, [place], PA-C 11/15/16 2149    Eber Hong, MD 11/17/16 [phone]

## 2016-11-16 LAB — URINE CULTURE: Culture: >=100000 — AB

## 2016-11-17 ENCOUNTER — Telehealth: Payer: Self-pay | Admitting: Emergency Medicine

## 2016-11-17 NOTE — Telephone Encounter (Signed)
Post ED Visit - Positive Culture Follow-up  Culture report reviewed by antimicrobial stewardship pharmacist:   Enzo Bi, Pharm.D.  Celedonio Miyamoto, Pharm.D., BCPS AQ-ID  Garvin Fila, Pharm.D., BCPS  Georgina Pillion, Pharm.D., BCPS  [place], [address].D., BCPS, AAHIVP  Estella Husk, Pharm.D., BCPS, AAHIVP  Lysle Pearl, PharmD, BCPS  Casilda Carls, PharmD, BCPS  Pollyann Samples, PharmD, BCPS  Positive urine culture Treated with cephalexin, organism sensitive to the same and no further patient follow-up is required at this time.  Berle Mull 11/17/2016, 11:20 AM

## 2017-08-07 IMAGING — CR DG CHEST 2V
2 series · 2 of 2 positions shown · non-contrast
Comparison: 02/26/2016

CLINICAL DATA: Cough for 1 week, asthma

EXAM:
CHEST  2 VIEW

[w chest pa]
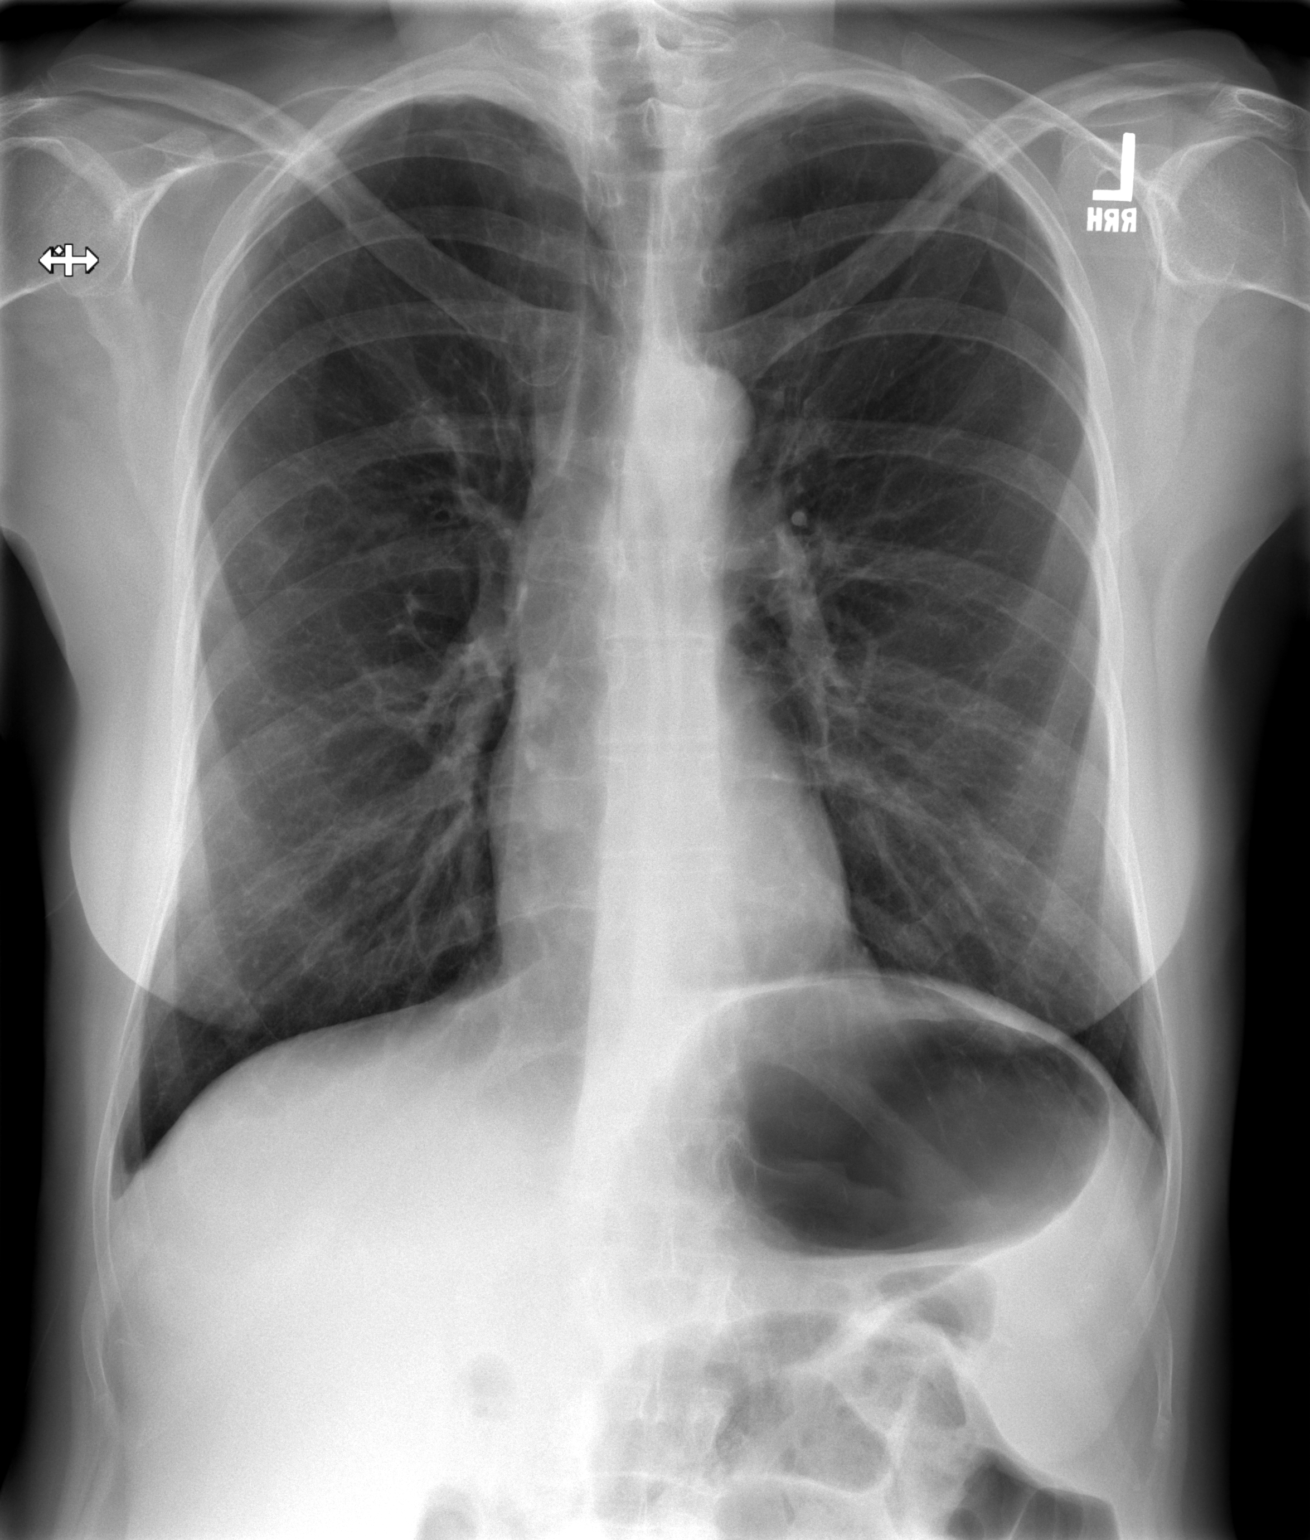

[w chest lat]
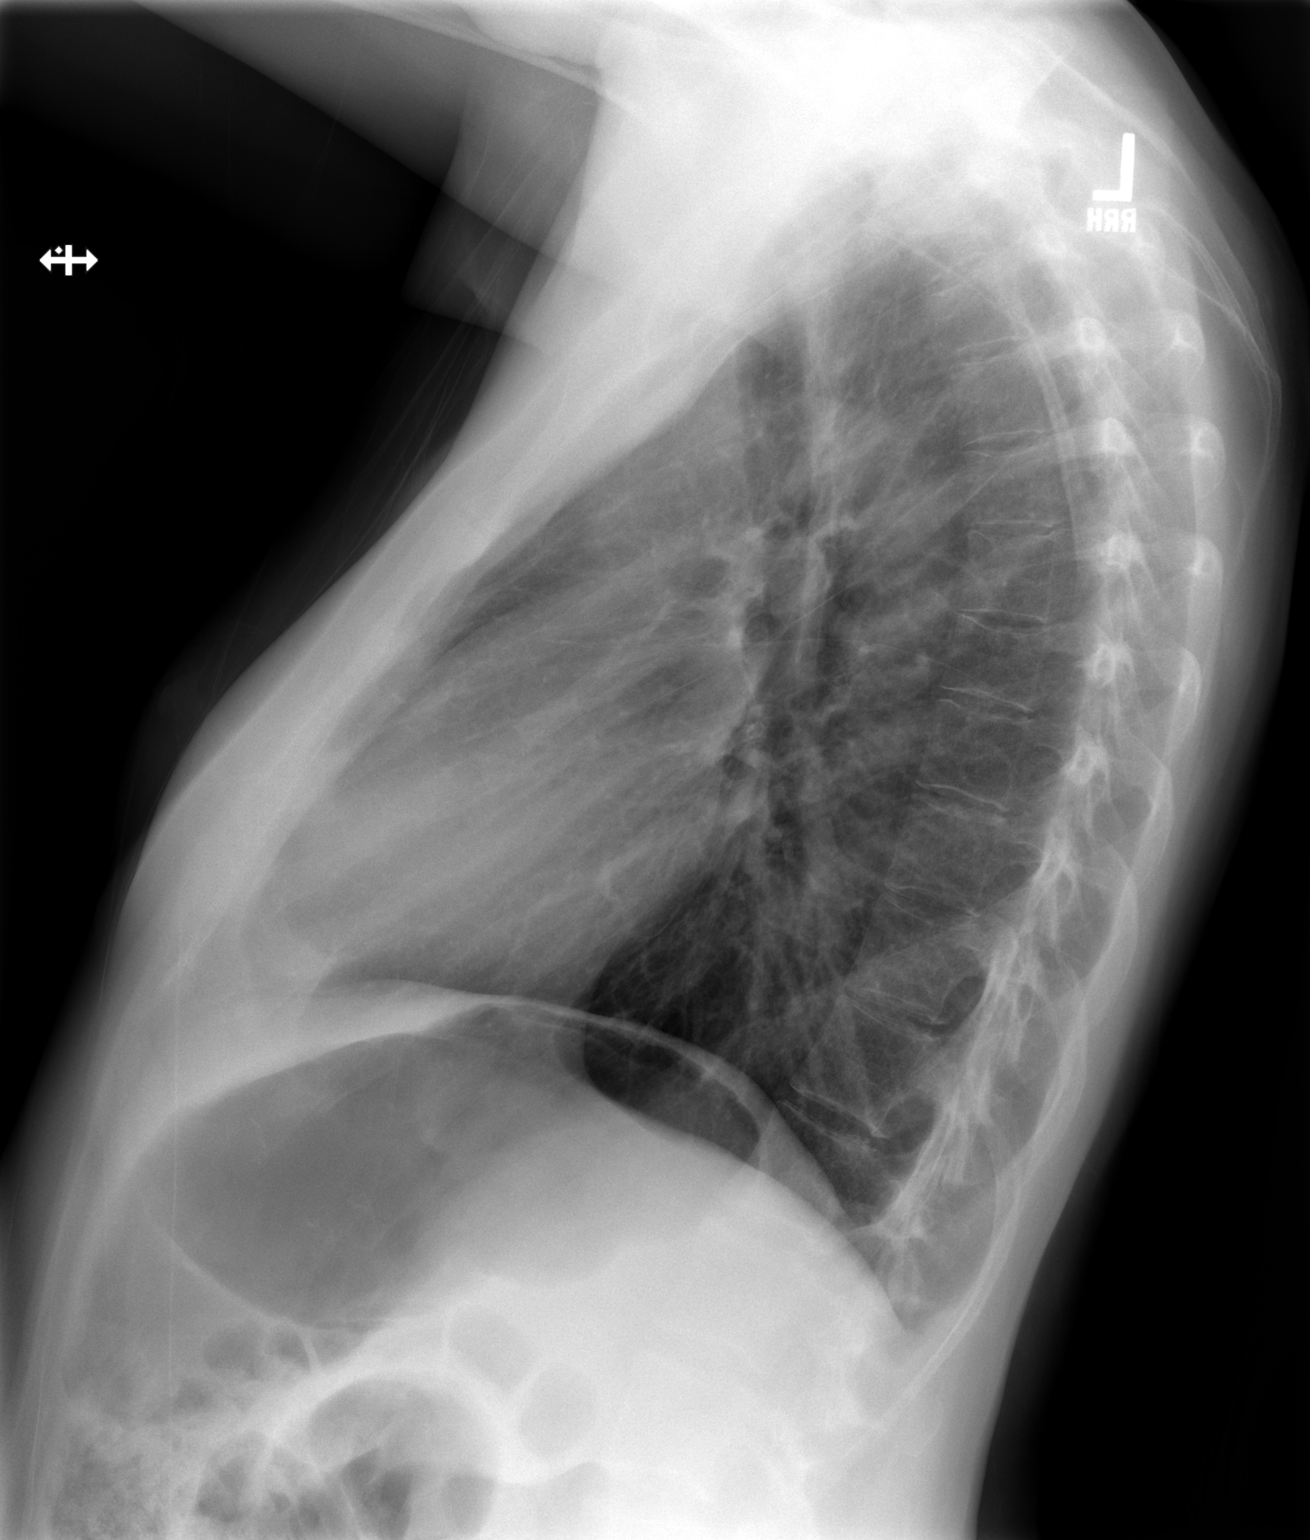

[2 of 2 positions shown; findings below may reference images not displayed]

FINDINGS: Hyperinflation of the lungs. Heart and mediastinal contours are
within normal limits. No focal opacities or effusions. No acute bony
abnormality.
IMPRESSION: No active cardiopulmonary disease.

## 2022-03-11 DIAGNOSIS — F319 Bipolar disorder, unspecified: Secondary | ICD-10-CM | POA: Diagnosis not present

## 2022-05-20 DIAGNOSIS — F319 Bipolar disorder, unspecified: Secondary | ICD-10-CM | POA: Diagnosis not present

## 2022-05-26 DIAGNOSIS — Z1231 Encounter for screening mammogram for malignant neoplasm of breast: Secondary | ICD-10-CM | POA: Diagnosis not present

## 2022-05-26 DIAGNOSIS — R92323 Mammographic fibroglandular density, bilateral breasts: Secondary | ICD-10-CM | POA: Diagnosis not present

## 2022-07-13 DIAGNOSIS — Z0000 Encounter for general adult medical examination without abnormal findings: Secondary | ICD-10-CM | POA: Diagnosis not present

## 2022-07-13 DIAGNOSIS — Z7182 Exercise counseling: Secondary | ICD-10-CM | POA: Diagnosis not present

## 2022-07-13 DIAGNOSIS — E032 Hypothyroidism due to medicaments and other exogenous substances: Secondary | ICD-10-CM | POA: Diagnosis not present

## 2022-07-13 DIAGNOSIS — Z713 Dietary counseling and surveillance: Secondary | ICD-10-CM | POA: Diagnosis not present

## 2022-07-13 DIAGNOSIS — Z124 Encounter for screening for malignant neoplasm of cervix: Secondary | ICD-10-CM | POA: Diagnosis not present

## 2022-07-13 DIAGNOSIS — F17200 Nicotine dependence, unspecified, uncomplicated: Secondary | ICD-10-CM | POA: Diagnosis not present

## 2022-07-13 DIAGNOSIS — E663 Overweight: Secondary | ICD-10-CM | POA: Diagnosis not present

## 2022-07-13 DIAGNOSIS — E119 Type 2 diabetes mellitus without complications: Secondary | ICD-10-CM | POA: Diagnosis not present

## 2022-07-13 DIAGNOSIS — E785 Hyperlipidemia, unspecified: Secondary | ICD-10-CM | POA: Diagnosis not present

## 2022-08-19 DIAGNOSIS — F319 Bipolar disorder, unspecified: Secondary | ICD-10-CM | POA: Diagnosis not present

## 2022-09-24 DIAGNOSIS — K7460 Unspecified cirrhosis of liver: Secondary | ICD-10-CM | POA: Diagnosis not present

## 2022-10-22 DIAGNOSIS — K7460 Unspecified cirrhosis of liver: Secondary | ICD-10-CM | POA: Diagnosis not present

## 2022-10-22 DIAGNOSIS — R160 Hepatomegaly, not elsewhere classified: Secondary | ICD-10-CM | POA: Diagnosis not present

## 2022-10-22 DIAGNOSIS — R932 Abnormal findings on diagnostic imaging of liver and biliary tract: Secondary | ICD-10-CM | POA: Diagnosis not present

## 2022-10-22 DIAGNOSIS — K769 Liver disease, unspecified: Secondary | ICD-10-CM | POA: Diagnosis not present

## 2022-10-22 DIAGNOSIS — N281 Cyst of kidney, acquired: Secondary | ICD-10-CM | POA: Diagnosis not present

## 2022-10-28 DIAGNOSIS — F319 Bipolar disorder, unspecified: Secondary | ICD-10-CM | POA: Diagnosis not present

## 2022-12-23 DIAGNOSIS — F319 Bipolar disorder, unspecified: Secondary | ICD-10-CM | POA: Diagnosis not present

## 2024-01-16 ENCOUNTER — Emergency Department: Payer: MEDICAID

## 2024-01-16 ENCOUNTER — Emergency Department
Admission: EM | Admit: 2024-01-16 | Discharge: 2024-01-16 | Disposition: A | Discharge status: Home / Self Care | Payer: MEDICAID | Attending: Emergency Medicine | Admitting: Emergency Medicine

## 2024-01-16 ENCOUNTER — Other Ambulatory Visit: Payer: Self-pay

## 2024-01-16 DIAGNOSIS — E109 Type 1 diabetes mellitus without complications: Secondary | ICD-10-CM | POA: Insufficient documentation

## 2024-01-16 DIAGNOSIS — S20211A Contusion of right front wall of thorax, initial encounter: Secondary | ICD-10-CM | POA: Insufficient documentation

## 2024-01-16 DIAGNOSIS — W1839XA Other fall on same level, initial encounter: Secondary | ICD-10-CM | POA: Insufficient documentation

## 2024-01-16 DIAGNOSIS — R0789 Other chest pain: Secondary | ICD-10-CM | POA: Diagnosis present

## 2024-01-16 DIAGNOSIS — J45909 Unspecified asthma, uncomplicated: Secondary | ICD-10-CM | POA: Insufficient documentation

## 2024-01-16 MED ORDER — LIDOCAINE 5 % EX PTCH
1.0000 | MEDICATED_PATCH | Freq: Once | CUTANEOUS | Status: DC
  Administered 2024-01-16: 1 via TRANSDERMAL
  Filled 2024-01-16: qty 1

## 2024-01-16 MED ORDER — CYCLOBENZAPRINE HCL 10 MG PO TABS
10.0000 mg | ORAL_TABLET | Freq: Once | ORAL | Status: AC
  Administered 2024-01-16: 10 mg via ORAL
  Filled 2024-01-16: qty 1

## 2024-01-16 MED ORDER — LIDOCAINE 5 % EX PTCH: 1.0000 | MEDICATED_PATCH | Freq: Two times a day (BID) | CUTANEOUS | 0 refills | Status: AC | PRN

## 2024-01-16 MED ORDER — CYCLOBENZAPRINE HCL 5 MG PO TABS: 5.0000 mg | ORAL_TABLET | Freq: Three times a day (TID) | ORAL | 0 refills | Status: AC | PRN

## 2024-01-16 NOTE — Discharge Instructions (Addendum)
 Your exam and x-ray did not show of an acute or a lung injury.  Your symptoms are consistent with a chest wall bruise or contusion.  You may feel a few days of tenderness and soreness with range of motion and breathing.  Use the prescription muscle relaxant along with the lidocaine  patches as directed.  I would avoid using ice or heat therapy while wearing the lidocaine  sticker.  Continue with your OTC Tylenol  and Motrin .  Follow-up with your primary provider for ongoing evaluation.

## 2024-01-16 NOTE — ED Triage Notes (Signed)
 Lost balance 4 days ago and fell onto right side / right arm. ARrives today c/o right rib pain.  Pain worse with C+DB and painful to palpation.  No SOB. No DOE skin warm and dry.

## 2024-01-16 NOTE — ED Provider Notes (Signed)
 [facility] Emergency Department Provider Note     Event Date/Time   First MD Initiated Contact with Patient 01/16/24 1515     (approximate)   History   Fall   HPI  [name] is a 63 y.o. female with a history of bipolar 1, diabetes type 2, asthma, hep C, presents to the ED for right sided chest wall pain.  Patient sustained a mechanical fall about 4 days prior but she landed on her adducted arm, endorses some pain to the lateral chest just into the axilla.  No frank shortness of breath, cough, or congestion.  Movement and deep breaths aggravates the discomfort.   Physical Exam   Triage Vital Signs: ED Triage Vitals  Encounter Vitals Group     BP 01/16/24 1320 (!) 129/53     Girls Systolic BP Percentile --      Girls Diastolic BP Percentile --      Boys Systolic BP Percentile --      Boys Diastolic BP Percentile --      Pulse Rate 01/16/24 1320 74     Resp 01/16/24 1320 16     Temp 01/16/24 1320 98.2 F (36.8 C)     Temp Source 01/16/24 1320 Oral     SpO2 01/16/24 1320 99 %     Weight 01/16/24 1321 145 lb 15.1 oz (66.2 kg)     Height --      Head Circumference --      Peak Flow --      Pain Score 01/16/24 1320 7     Pain Loc --      Pain Education --      Exclude from Growth Chart --     Most recent vital signs: Vitals:   01/16/24 1320 01/16/24 1606  BP: (!) 129/53 127/61  Pulse: 74 79  Resp: 16 17  Temp: 98.2 F (36.8 C) 98.5 F (36.9 C)  SpO2: 99% 100%    General Awake, no distress. NAD HEENT NCAT. PERRL. EOMI. No rhinorrhea. Mucous membranes are moist.  CV:  Good peripheral perfusion. RRR RESP:  Normal effort. CTA no gross deformity, ecchymosis, or dyskinetic chest wall movement.  Mildly tender to palpation to the midline right chest at the axillary line.  ABD:  No distention.  MSK:  AROM of all extremities.   ED Results / Procedures / Treatments   Labs (all labs ordered are listed, but only abnormal results are  displayed) Labs Reviewed - No data to display   EKG   RADIOLOGY  I personally viewed and evaluated these images as part of my medical decision making, as well as reviewing the written report by the radiologist.  ED Provider Interpretation: No acute findings  DG Chest 2 View Result Date: 01/16/2024 EXAM: 2 VIEW(S) XRAY OF THE CHEST 01/16/2024 01:34:00 PM COMPARISON: 07/27/2016 CLINICAL HISTORY: fall, right rib pain FINDINGS: LUNGS AND PLEURA: Trace biapical pleural scarring or scarring. No pleural effusion. No pneumothorax. HEART AND MEDIASTINUM: No acute abnormality of the cardiac and mediastinal silhouettes. BONES AND SOFT TISSUES: No acute osseous abnormality. IMPRESSION: 1. No acute cardiopulmonary process. Electronically signed by: Morgane Naveau MD 01/16/2024 01:56 PM EST RP Workstation: [id number]    PROCEDURES:  Critical Care performed: No  Procedures   MEDICATIONS ORDERED IN ED: Medications  cyclobenzaprine  (FLEXERIL ) tablet 10 mg (10 mg Oral Given 01/16/24 1548)     IMPRESSION / MDM / ASSESSMENT AND PLAN / ED COURSE  I reviewed the  triage vital signs and the nursing notes.                              Differential diagnosis includes, but is not limited to, chest wall contusion, chest wall pain, rib fracture, pneumothorax, myalgias  Patient's presentation is most consistent with acute complicated illness / injury requiring diagnostic workup.  Patient's diagnosis is consistent with chest wall pain and contusion following mechanical fall.  Patient with reassuring exam and workup at this time.  No x-ray evidence of any acute intrathoracic process or rib deformity.  Does represent a contusion and strain to the chest wall.  Patient will be discharged home with prescriptions for cyclobenzaprine  and Lidoderm  patches. Patient is to follow up with her primary provider as needed or otherwise directed. Patient is given ED precautions to return to the ED for any worsening or new  symptoms.     FINAL CLINICAL IMPRESSION(S) / ED DIAGNOSES   Final diagnoses:  Chest wall contusion, right, initial encounter     Rx / DC Orders   ED Discharge Orders          Ordered    cyclobenzaprine  (FLEXERIL ) 5 MG tablet  3 times daily PRN        01/16/24 1547    lidocaine  (LIDODERM ) 5 %  Every 12 hours PRN        01/16/24 1547             Note:  This document was prepared using Dragon voice recognition software and may include unintentional dictation errors.    Loyd Candida LULLA Aldona, PA-C 01/16/24 2250    Jacolyn Pae, MD 01/20/24 [phone]

## 2024-02-18 ENCOUNTER — Telehealth: Payer: MEDICAID | Admitting: Physician Assistant

## 2024-02-18 DIAGNOSIS — R3989 Other symptoms and signs involving the genitourinary system: Secondary | ICD-10-CM | POA: Diagnosis not present

## 2024-02-18 MED ORDER — CEPHALEXIN 500 MG PO CAPS: 500.0000 mg | ORAL_CAPSULE | Freq: Four times a day (QID) | ORAL | 0 refills | Status: AC

## 2024-02-18 NOTE — Progress Notes (Signed)
 " Virtual Visit Consent   [name], you are scheduled for a virtual visit with a [facility] provider today. Just as with appointments in the office, your consent must be obtained to participate. Your consent will be active for this visit and any virtual visit you may have with one of our providers in the next 365 days. If you have a MyChart account, a copy of this consent can be sent to you electronically.  As this is a virtual visit, video technology does not allow for your provider to perform a traditional examination. This may limit your provider's ability to fully assess your condition. If your provider identifies any concerns that need to be evaluated in person or the need to arrange testing (such as labs, EKG, etc.), we will make arrangements to do so. Although advances in technology are sophisticated, we cannot ensure that it will always work on either your end or our end. If the connection with a video visit is poor, the visit may have to be switched to a telephone visit. With either a video or telephone visit, we are not always able to ensure that we have a secure connection.  By engaging in this virtual visit, you consent to the provision of healthcare and authorize for your insurance to be billed (if applicable) for the services provided during this visit. Depending on your insurance coverage, you may receive a charge related to this service.  I need to obtain your verbal consent now. Are you willing to proceed with your visit today? [name] has provided verbal consent on 02/18/2024 for a virtual visit (video or telephone). Teena Shuck, [place]  Date: 02/18/2024 6:45 PM   Virtual Visit via Video Note   I, Teena Shuck, connected with  [name]  ([id number], 10/14/1960) on 02/18/24 at  7:00 PM EST by a video-enabled telemedicine application and verified that I am speaking with the correct person using two identifiers.  Location: Patient: Virtual Visit Location Patient:  Home Provider: Virtual Visit Location Provider: Home Office   I discussed the limitations of evaluation and management by telemedicine and the availability of in person appointments. The patient expressed understanding and agreed to proceed.    History of Present Illness: [name] is a 64 y.o. who identifies as a female who was assigned female at birth, and is being seen today for UTI.  HPI: Urinary Tract Infection  This is a new problem. The current episode started yesterday. The problem occurs every urination. The quality of the pain is described as burning. The pain is moderate. There has been no fever. There is No history of pyelonephritis. Associated symptoms include frequency and urgency. She has tried nothing for the symptoms.    Problems:  Patient Active Problem List   Diagnosis Date Noted   Fatty liver 01/24/2012   DM2 (diabetes mellitus, type 2) (HCC) 01/24/2012   Hepatitis C 01/23/2012   Elevated liver enzymes 01/23/2012   Hypoxia 01/23/2012   DKA (diabetic ketoacidosis) (HCC) 01/22/2012   Diabetes mellitus (HCC) 01/22/2012   Hyponatremia 01/22/2012   Bipolar disorder, current episode manic severe with psychotic features (HCC) 11/24/2011    Class: Chronic    Allergies: Allergies[1] Medications: Current Medications[2]  Observations/Objective: Patient is well-developed, well-nourished in no acute distress.  Resting comfortably  at home.  Head is normocephalic, atraumatic.  No labored breathing.  Speech is clear and coherent with logical content.  Patient is alert and oriented at baseline.    Assessment and  Plan: 1. Suspected UTI (Primary)  Patient presenting with urinary urgency and frequency, I suspect UTI. I also considered PID, pregnancy, ectopic pregnancy, endometriosis, yeast vaginits,  tubovarian abscess, appendicitis, BV ,  and pyelonephritis, but this appears less likely considering the data gathered thus far.  Medication prescribed. I have instructed  the patient to present to the nearest ER at any time if there are any new or worsening symptoms.  The patient expressed understanding of and agreement with this plan.  Opportunity was given for questions prior to discharge and all stated questions were answered to the patient's satisfaction.    Follow Up Instructions: I discussed the assessment and treatment plan with the patient. The patient was provided an opportunity to ask questions and all were answered. The patient agreed with the plan and demonstrated an understanding of the instructions.  A copy of instructions were sent to the patient via MyChart unless otherwise noted below.     The patient was advised to call back or seek an in-person evaluation if the symptoms worsen or if the condition fails to improve as anticipated.    Tate Zagal, PA-C     [1] No Known Allergies [2]  Current Outpatient Medications:    acetaminophen  (TYLENOL ) 500 MG tablet, Take 500 mg by mouth every 6 (six) hours as needed. Pain, Disp: , Rfl:    busPIRone (BUSPAR) 10 MG tablet, Take 10 mg by mouth 3 (three) times daily., Disp: , Rfl: 1   cephALEXin  (KEFLEX ) 500 MG capsule, Take 1 capsule (500 mg total) by mouth 4 (four) times daily. For 7 days, Disp: 28 capsule, Rfl: 0   cyclobenzaprine  (FLEXERIL ) 5 MG tablet, Take 1 tablet (5 mg total) by mouth 3 (three) times daily as needed., Disp: 15 tablet, Rfl: 0   diphenhydrAMINE (BENADRYL) 50 MG capsule, Take 50 mg by mouth daily as needed for itching., Disp: , Rfl:    levothyroxine (SYNTHROID, LEVOTHROID) 125 MCG tablet, Take 1 tablet by mouth daily., Disp: , Rfl:    lithium  carbonate (LITHOBID ) 300 MG CR tablet, Take 300 mg by mouth 2 (two) times daily., Disp: , Rfl:    phenazopyridine  (PYRIDIUM ) 200 MG tablet, Take 1 tablet (200 mg total) by mouth 3 (three) times daily., Disp: 6 tablet, Rfl: 0   predniSONE  (DELTASONE ) 20 MG tablet, Take 2 tablets (40 mg total) by mouth daily. For 5 days, Disp: 10 tablet, Rfl: 0    QUEtiapine (SEROQUEL) 200 MG tablet, Take 400 mg by mouth at bedtime., Disp: , Rfl: 3   sitaGLIPtin-metformin  (JANUMET) 50-500 MG tablet, Take 1 tablet by mouth daily., Disp: , Rfl:   "

## 2024-02-18 NOTE — Patient Instructions (Signed)
 " [name], thank you for joining Teena Shuck, PA-C for today's virtual visit.  While this provider is not your primary care provider (PCP), if your PCP is located in our provider database this encounter information will be shared with them immediately following your visit.   A [facility] MyChart account gives you access to today's visit and all your visits, tests, and labs performed at [facility]  click here if you don't have a [facility] MyChart account or go to [email or web address]  Consent: (Patient) [name] provided verbal consent for this virtual visit at the beginning of the encounter.  Current Medications:  Current Outpatient Medications:    acetaminophen  (TYLENOL ) 500 MG tablet, Take 500 mg by mouth every 6 (six) hours as needed. Pain, Disp: , Rfl:    busPIRone (BUSPAR) 10 MG tablet, Take 10 mg by mouth 3 (three) times daily., Disp: , Rfl: 1   cephALEXin  (KEFLEX ) 500 MG capsule, Take 1 capsule (500 mg total) by mouth 4 (four) times daily. For 7 days, Disp: 28 capsule, Rfl: 0   cyclobenzaprine  (FLEXERIL ) 5 MG tablet, Take 1 tablet (5 mg total) by mouth 3 (three) times daily as needed., Disp: 15 tablet, Rfl: 0   diphenhydrAMINE (BENADRYL) 50 MG capsule, Take 50 mg by mouth daily as needed for itching., Disp: , Rfl:    levothyroxine (SYNTHROID, LEVOTHROID) 125 MCG tablet, Take 1 tablet by mouth daily., Disp: , Rfl:    lithium  carbonate (LITHOBID ) 300 MG CR tablet, Take 300 mg by mouth 2 (two) times daily., Disp: , Rfl:    phenazopyridine  (PYRIDIUM ) 200 MG tablet, Take 1 tablet (200 mg total) by mouth 3 (three) times daily., Disp: 6 tablet, Rfl: 0   predniSONE  (DELTASONE ) 20 MG tablet, Take 2 tablets (40 mg total) by mouth daily. For 5 days, Disp: 10 tablet, Rfl: 0   QUEtiapine (SEROQUEL) 200 MG tablet, Take 400 mg by mouth at bedtime., Disp: , Rfl: 3   sitaGLIPtin-metformin  (JANUMET) 50-500 MG tablet, Take 1 tablet by mouth daily., Disp: , Rfl:     Medications ordered in this encounter:  Meds ordered this encounter  Medications   cephALEXin  (KEFLEX ) 500 MG capsule    Sig: Take 1 capsule (500 mg total) by mouth 4 (four) times daily. For 7 days    Dispense:  28 capsule    Refill:  0    Supervising Provider:   BLAISE ALEENE KIDD [[id number]]     *If you need refills on other medications prior to your next appointment, please contact your pharmacy*  Follow-Up: Call back or seek an in-person evaluation if the symptoms worsen or if the condition fails to improve as anticipated.  [facility] [phone]  Other Instructions Follow up with primary provider in 24-48 hours. Report to nearest ER with any worsening symptoms.    If you have been instructed to have an in-person evaluation today at a local Urgent Care facility, please use the link below. It will take you to a list of all of our available [facility], including address, phone number and hours of operation. Please do not delay care.  [facility]  If you or a family member do not have a primary care provider, use the link below to schedule a visit and establish care. When you choose a [facility] primary care physician or advanced practice provider, you gain a long-term partner in health. Find a Primary Care Provider  Learn more about  [facility]'s in-office and virtual care options: [facility] Get Care Now  "
